# Patient Record
Sex: Male | Born: 1960 | Race: Black or African American | Hispanic: No | Marital: Single | Smoking: Current every day smoker
Health system: Southern US, Community
[De-identification: ages and names within clinical notes are randomized; demographics above are authoritative.]

## PROBLEM LIST (undated history)

## (undated) ENCOUNTER — Emergency Department (HOSPITAL_COMMUNITY): Payer: Medicare Other

## (undated) DIAGNOSIS — I10 Essential (primary) hypertension: Secondary | ICD-10-CM

## (undated) DIAGNOSIS — M722 Plantar fascial fibromatosis: Secondary | ICD-10-CM

## (undated) DIAGNOSIS — K259 Gastric ulcer, unspecified as acute or chronic, without hemorrhage or perforation: Secondary | ICD-10-CM

## (undated) DIAGNOSIS — G709 Myoneural disorder, unspecified: Secondary | ICD-10-CM

## (undated) DIAGNOSIS — N200 Calculus of kidney: Secondary | ICD-10-CM

## (undated) DIAGNOSIS — IMO0001 Reserved for inherently not codable concepts without codable children: Secondary | ICD-10-CM

## (undated) DIAGNOSIS — M48061 Spinal stenosis, lumbar region without neurogenic claudication: Secondary | ICD-10-CM

## (undated) HISTORY — DX: Essential (primary) hypertension: I10

## (undated) HISTORY — PX: MANDIBLE FRACTURE SURGERY: SHX706

## (undated) HISTORY — DX: Spinal stenosis, lumbar region without neurogenic claudication: M48.061

## (undated) HISTORY — PX: OTHER SURGICAL HISTORY: SHX169

## (undated) HISTORY — DX: Myoneural disorder, unspecified: G70.9

---

## 1998-09-21 ENCOUNTER — Encounter: Payer: Self-pay | Admitting: Emergency Medicine

## 1998-09-21 ENCOUNTER — Emergency Department (HOSPITAL_COMMUNITY): Admission: EM | Admit: 1998-09-21 | Discharge: 1998-09-21 | Payer: Self-pay | Admitting: Emergency Medicine

## 1998-09-22 ENCOUNTER — Observation Stay (HOSPITAL_COMMUNITY): Admission: RE | Admit: 1998-09-22 | Discharge: 1998-09-22 | Payer: Self-pay | Admitting: Oral and Maxillofacial Surgery

## 1998-09-22 ENCOUNTER — Encounter: Payer: Self-pay | Admitting: Oral and Maxillofacial Surgery

## 2002-01-11 ENCOUNTER — Emergency Department (HOSPITAL_COMMUNITY): Admission: EM | Admit: 2002-01-11 | Discharge: 2002-01-11 | Payer: Self-pay | Admitting: Emergency Medicine

## 2002-01-11 ENCOUNTER — Encounter: Payer: Self-pay | Admitting: Emergency Medicine

## 2003-09-09 ENCOUNTER — Emergency Department (HOSPITAL_COMMUNITY): Admission: EM | Admit: 2003-09-09 | Discharge: 2003-09-09 | Payer: Self-pay | Admitting: Emergency Medicine

## 2004-11-10 ENCOUNTER — Emergency Department (HOSPITAL_COMMUNITY): Admission: EM | Admit: 2004-11-10 | Discharge: 2004-11-10 | Payer: Self-pay | Admitting: Emergency Medicine

## 2005-08-20 ENCOUNTER — Encounter: Admission: RE | Admit: 2005-08-20 | Discharge: 2005-08-20 | Payer: Self-pay | Admitting: Internal Medicine

## 2006-12-08 ENCOUNTER — Emergency Department (HOSPITAL_COMMUNITY): Admission: EM | Admit: 2006-12-08 | Discharge: 2006-12-08 | Payer: Self-pay | Admitting: *Deleted

## 2007-12-16 ENCOUNTER — Emergency Department (HOSPITAL_COMMUNITY): Admission: EM | Admit: 2007-12-16 | Discharge: 2007-12-16 | Payer: Self-pay | Admitting: Emergency Medicine

## 2009-10-21 ENCOUNTER — Emergency Department (HOSPITAL_COMMUNITY): Admission: EM | Admit: 2009-10-21 | Discharge: 2009-10-21 | Payer: Self-pay | Admitting: Family Medicine

## 2009-10-23 ENCOUNTER — Emergency Department (HOSPITAL_COMMUNITY): Admission: EM | Admit: 2009-10-23 | Discharge: 2009-10-23 | Payer: Self-pay | Admitting: Emergency Medicine

## 2011-04-11 LAB — CBC
HCT: 35.5 — ABNORMAL LOW
Hemoglobin: 12.5 — ABNORMAL LOW
Platelets: 197
WBC: 4.8

## 2011-04-11 LAB — DIFFERENTIAL
Eosinophils Relative: 3
Lymphocytes Relative: 42
Lymphs Abs: 2
Monocytes Absolute: 0.5

## 2011-04-11 LAB — POCT I-STAT, CHEM 8
BUN: 17
Calcium, Ion: 1.19
Chloride: 106
Potassium: 4.6

## 2011-04-11 LAB — POCT CARDIAC MARKERS
CKMB, poc: <1 — ABNORMAL LOW
Operator id: 231701
Troponin i, poc: <0.05

## 2011-04-11 LAB — D-DIMER, QUANTITATIVE: D-Dimer, Quant: 0.54 — ABNORMAL HIGH

## 2012-05-22 ENCOUNTER — Emergency Department (HOSPITAL_COMMUNITY)
Admission: EM | Admit: 2012-05-22 | Discharge: 2012-05-22 | Disposition: A | Discharge status: Home / Self Care | Payer: Medicaid Other | Attending: Emergency Medicine | Admitting: Emergency Medicine

## 2012-05-22 ENCOUNTER — Emergency Department (HOSPITAL_COMMUNITY): Payer: Medicaid Other

## 2012-05-22 ENCOUNTER — Encounter (HOSPITAL_COMMUNITY): Payer: Self-pay | Admitting: Cardiology

## 2012-05-22 DIAGNOSIS — Y9389 Activity, other specified: Secondary | ICD-10-CM | POA: Insufficient documentation

## 2012-05-22 DIAGNOSIS — R0989 Other specified symptoms and signs involving the circulatory and respiratory systems: Secondary | ICD-10-CM | POA: Insufficient documentation

## 2012-05-22 DIAGNOSIS — W1809XA Striking against other object with subsequent fall, initial encounter: Secondary | ICD-10-CM | POA: Insufficient documentation

## 2012-05-22 DIAGNOSIS — R0609 Other forms of dyspnea: Secondary | ICD-10-CM | POA: Insufficient documentation

## 2012-05-22 DIAGNOSIS — S20219A Contusion of unspecified front wall of thorax, initial encounter: Secondary | ICD-10-CM

## 2012-05-22 DIAGNOSIS — Y9289 Other specified places as the place of occurrence of the external cause: Secondary | ICD-10-CM | POA: Insufficient documentation

## 2012-05-22 DIAGNOSIS — F17200 Nicotine dependence, unspecified, uncomplicated: Secondary | ICD-10-CM | POA: Insufficient documentation

## 2012-05-22 DIAGNOSIS — R059 Cough, unspecified: Secondary | ICD-10-CM | POA: Insufficient documentation

## 2012-05-22 DIAGNOSIS — R05 Cough: Secondary | ICD-10-CM | POA: Insufficient documentation

## 2012-05-22 MED ORDER — MELOXICAM 15 MG PO TABS: 15.0000 mg | ORAL_TABLET | Freq: Every day | ORAL | Status: DC

## 2012-05-22 MED ORDER — HYDROCODONE-ACETAMINOPHEN 5-325 MG PO TABS
1.0000 | ORAL_TABLET | ORAL | Status: DC | PRN
Start: 2012-05-22 — End: 2015-08-14

## 2012-05-22 NOTE — ED Notes (Addendum)
Pt reports he was playing with his daughter and left on his left side on Wednesday. Now complaining of rib pain and pain with inspiration.

## 2012-05-22 NOTE — ED Provider Notes (Signed)
Medical screening examination/treatment/procedure(s) were conducted as a shared visit with non-physician practitioner(s) and myself.  I personally evaluated the patient during the encounter  3 days of L lateral rib pain after wrestling with adult daughter. Worse with movement. TTP lateral ribs, no SOB.  Breath sounds equal.  EMERGENCY DEPARTMENT [place] FAST EXAM  INDICATIONS:Blunt trauma to the Thorax  PERFORMED BY: Myself  IMAGES ARCHIVED?: No  FINDINGS: All views negative  LIMITATIONS:    INTERPRETATION:  No abdominal free fluid and No pericardial effusion  COMMENT:        Wesley Octave, MD 05/22/12 1829

## 2012-05-22 NOTE — ED Provider Notes (Signed)
History     CSN: [id number]  Arrival date & time 05/22/12  8657   First MD Initiated Contact with Patient 05/22/12 0945      Chief Complaint  Patient presents with  . rib pain     (Consider location/radiation/quality/duration/timing/severity/associated sxs/prior treatment) HPI Comments: Wesley Prince 51 y.o. male   The chief complaint is: Patient presents with:   rib pain    Onset Wednesday wrestling. Slipped and fell side of sofa.  Immediated pain.  Pain has increased steadily since that time. Unsure if there is bruising or swelling. Pain with inspiration and movement of arm, cough, twisting. on the left rib cage.  Pain is constant. sitting still is less painful. Patient has not taken any medication to treat. Interfering with ADLs and ability to perform at work which involves heavy lifting.   + smoker- trying to quit.    2 brothers with CAD and stents. Denies  chest tightness or pressure, radiation to left arm, jaw or back, nausea, or diaphoresis.       The history is provided by the patient and medical records. No language interpreter was used.    History reviewed. No pertinent past medical history.  History reviewed. No pertinent past surgical history.  History reviewed. No pertinent family history.  History  Substance Use Topics  . Smoking status: Current Every Day Smoker  . Smokeless tobacco: Not on file  . Alcohol Use: Yes      Review of Systems  Review of Systems  Constitutional: Negative.  Negative for fever and chills.  HENT: Negative.   Eyes: Negative.   Respiratory: Positive for chest wall pain.  Negative wheezing, cough, congestion, shortness of breath. Cardiovascular: Negative.  Negative for chest pain and palpitations.  Gastrointestinal: Negative.  Negative for vomiting, abdominal pain, diarrhea and constipation.  Genitourinary: Negative.  Negative for dysuria, urgency and frequency.  Musculoskeletal: Negative.  Negative for myalgias and  arthralgias.  Skin: Negative for rash and wound.  Neurological: Negative.  Negative for headaches.  Psychiatric/Behavioral: Negative.   All other systems reviewed and are negative.    Allergies  Review of patient's allergies indicates no known allergies.  Home Medications  No current outpatient prescriptions on file.  BP 132/78  Pulse 82  Temp 98 F (36.7 C) (Oral)  Resp 18  SpO2 98%  Physical Exam Physical Exam  Nursing note and vitals reviewed. Constitutional: He appears well-developed and well-nourished. No distress.  HENT:  Head: Normocephalic and atraumatic.  Eyes: Conjunctivae normal are normal. No scleral icterus.  Neck: Normal range of motion. Neck supple.  Cardiovascular: Normal rate, regular rhythm and normal heart sounds.   Pulmonary/Chest:  No respiratory distress.  oxygen saturation 100% on monitor.  And room air.  Tender to palpation of left anterior chest wall at approximately ribs 7, 8, and 9.  No wheezing, rhonchi, or decreased breath sounds. Abdominal: Soft. There is no tenderness.  Musculoskeletal: He exhibits no edema.  Neurological: He is alert.  Skin: Skin is warm and dry. He is not diaphoretic.  Psychiatric: His behavior is normal.    ED Course  Procedures (including critical care time)  Labs Reviewed - No data to display Dg Ribs Bilateral  05/22/2012  *RADIOLOGY REPORT*  Clinical Data: Fall.  Left chest wall pain.  BILATERAL RIBS - 3+ VIEW  Comparison: Chest CT, 12/16/2007  Findings: No rib fracture or rib lesion.  The visualized lungs are clear.  No pneumothorax.  IMPRESSION: No rib fracture or rib lesion.  Original Report Authenticated By: Amie Portland, M.D.      No diagnosis found.    MDM  10:32 AM BP 132/78  Pulse 82  Temp 98 F (36.7 C) (Oral)  Resp 18  SpO2 98% Patient presents with chest wall pain.  History and physical exam indicate musculoskeletal origin versus ACS or pulmonary problem.  I do not suspect pulmonary embolus.   She does have significant family history of coronary artery disease and he is a smoker.  However symptoms are reproducible with palpation of the chest wall.  The began with a rib x-ray to rule out fracture.  If negative he may warrant further workup.     11:34 AM BP 132/78  Pulse 82  Temp 98 F (36.7 C) (Oral)  Resp 18  SpO2 98% Neg xray.  3 days since onset.  I saw patient in shared visit with Dr. Manus Gunning who did Bedside US.  No free fluid and organs are intact. Will treat patient for Rib contusion. Discussed reasons to seek immediate care. Patient expresses understanding and agrees with plan.    Arthor Captain, PA-C 05/22/12 1136

## 2015-02-07 ENCOUNTER — Encounter (HOSPITAL_COMMUNITY): Payer: Self-pay | Admitting: Emergency Medicine

## 2015-02-07 ENCOUNTER — Emergency Department (HOSPITAL_COMMUNITY)
Admission: EM | Admit: 2015-02-07 | Discharge: 2015-02-07 | Discharge status: Against Medical Advice | Payer: Medicaid Other | Attending: Emergency Medicine | Admitting: Emergency Medicine

## 2015-02-07 DIAGNOSIS — Z720 Tobacco use: Secondary | ICD-10-CM | POA: Diagnosis not present

## 2015-02-07 DIAGNOSIS — R319 Hematuria, unspecified: Secondary | ICD-10-CM | POA: Diagnosis present

## 2015-02-07 HISTORY — DX: Gastric ulcer, unspecified as acute or chronic, without hemorrhage or perforation: K25.9

## 2015-02-07 HISTORY — DX: Calculus of kidney: N20.0

## 2015-02-07 HISTORY — DX: Plantar fascial fibromatosis: M72.2

## 2015-02-07 LAB — URINALYSIS, ROUTINE W REFLEX MICROSCOPIC
BILIRUBIN URINE: NEGATIVE
GLUCOSE, UA: NEGATIVE mg/dL
KETONES UR: NEGATIVE mg/dL
Nitrite: POSITIVE — AB
Protein, ur: NEGATIVE mg/dL
SPECIFIC GRAVITY, URINE: 1.028 (ref 1.005–1.030)
UROBILINOGEN UA: 0.2 mg/dL (ref 0.0–1.0)
pH: 5.5 (ref 5.0–8.0)

## 2015-02-07 LAB — URINE MICROSCOPIC-ADD ON

## 2015-02-07 NOTE — ED Notes (Signed)
Pt states he is "pissing blood"  Pt states it started yesterday  Pt states he has pain in his bilateral flank area, lower abdomen, and states at the end of his stream when he urinates he has pain  Pt states it started with flecks of blood but now at the end of his stream he has drops of blood that comes out of his penis  Pt states today he also coughed up some blood

## 2015-02-08 ENCOUNTER — Encounter (HOSPITAL_COMMUNITY): Payer: Self-pay

## 2015-02-08 ENCOUNTER — Emergency Department (HOSPITAL_COMMUNITY): Payer: Medicaid Other

## 2015-02-08 ENCOUNTER — Emergency Department (HOSPITAL_COMMUNITY)
Admission: EM | Admit: 2015-02-08 | Discharge: 2015-02-08 | Disposition: A | Discharge status: Home / Self Care | Payer: Medicaid Other | Attending: Emergency Medicine | Admitting: Emergency Medicine

## 2015-02-08 DIAGNOSIS — Z8739 Personal history of other diseases of the musculoskeletal system and connective tissue: Secondary | ICD-10-CM | POA: Insufficient documentation

## 2015-02-08 DIAGNOSIS — Z87442 Personal history of urinary calculi: Secondary | ICD-10-CM | POA: Insufficient documentation

## 2015-02-08 DIAGNOSIS — Z720 Tobacco use: Secondary | ICD-10-CM | POA: Insufficient documentation

## 2015-02-08 DIAGNOSIS — N390 Urinary tract infection, site not specified: Secondary | ICD-10-CM | POA: Insufficient documentation

## 2015-02-08 DIAGNOSIS — Z8719 Personal history of other diseases of the digestive system: Secondary | ICD-10-CM | POA: Insufficient documentation

## 2015-02-08 LAB — CBC WITH DIFFERENTIAL/PLATELET
BASOS ABS: 0 10*3/uL (ref 0.0–0.1)
BASOS PCT: 0 % (ref 0–1)
EOS PCT: 3 % (ref 0–5)
Eosinophils Absolute: 0.2 10*3/uL (ref 0.0–0.7)
HCT: 41.6 % (ref 39.0–52.0)
Hemoglobin: 13.9 g/dL (ref 13.0–17.0)
LYMPHS PCT: 41 % (ref 12–46)
Lymphs Abs: 3.2 10*3/uL (ref 0.7–4.0)
MCH: 30.2 pg (ref 26.0–34.0)
MCHC: 33.4 g/dL (ref 30.0–36.0)
MCV: 90.2 fL (ref 78.0–100.0)
MONOS PCT: 7 % (ref 3–12)
Monocytes Absolute: 0.5 10*3/uL (ref 0.1–1.0)
NEUTROS ABS: 3.8 10*3/uL (ref 1.7–7.7)
NEUTROS PCT: 49 % (ref 43–77)
PLATELETS: 189 10*3/uL (ref 150–400)
RBC: 4.61 MIL/uL (ref 4.22–5.81)
RDW: 12.8 % (ref 11.5–15.5)
WBC: 7.8 10*3/uL (ref 4.0–10.5)

## 2015-02-08 LAB — I-STAT CHEM 8, ED
BUN: 22 mg/dL — ABNORMAL HIGH (ref 6–20)
CREATININE: 1.2 mg/dL (ref 0.61–1.24)
Calcium, Ion: 1.2 mmol/L (ref 1.12–1.23)
Chloride: 104 mmol/L (ref 101–111)
Glucose, Bld: 98 mg/dL (ref 65–99)
HEMATOCRIT: 45 % (ref 39.0–52.0)
Hemoglobin: 15.3 g/dL (ref 13.0–17.0)
POTASSIUM: 4 mmol/L (ref 3.5–5.1)
SODIUM: 139 mmol/L (ref 135–145)
TCO2: 23 mmol/L (ref 0–100)

## 2015-02-08 LAB — URINE MICROSCOPIC-ADD ON

## 2015-02-08 LAB — URINALYSIS, ROUTINE W REFLEX MICROSCOPIC
BILIRUBIN URINE: NEGATIVE
Glucose, UA: NEGATIVE mg/dL
KETONES UR: NEGATIVE mg/dL
Nitrite: POSITIVE — AB
Protein, ur: NEGATIVE mg/dL
Specific Gravity, Urine: 1.023 (ref 1.005–1.030)
Urobilinogen, UA: 0.2 mg/dL (ref 0.0–1.0)
pH: 5.5 (ref 5.0–8.0)

## 2015-02-08 MED ORDER — KETOROLAC TROMETHAMINE 60 MG/2ML IM SOLN
60.0000 mg | Freq: Once | INTRAMUSCULAR | Status: AC
  Administered 2015-02-08: 60 mg via INTRAMUSCULAR
  Filled 2015-02-08: qty 2

## 2015-02-08 MED ORDER — CEPHALEXIN 500 MG PO CAPS: 500.0000 mg | ORAL_CAPSULE | Freq: Four times a day (QID) | ORAL | Status: DC

## 2015-02-08 MED ORDER — CEPHALEXIN 500 MG PO CAPS
500.0000 mg | ORAL_CAPSULE | Freq: Once | ORAL | Status: AC
  Administered 2015-02-08: 500 mg via ORAL
  Filled 2015-02-08: qty 1

## 2015-02-08 MED ORDER — TRAMADOL HCL 50 MG PO TABS: 50.0000 mg | ORAL_TABLET | Freq: Four times a day (QID) | ORAL | Status: DC | PRN

## 2015-02-08 NOTE — ED Notes (Signed)
PA at bedside.

## 2015-02-08 NOTE — ED Provider Notes (Signed)
CSN: [id number]     Arrival date & time 02/08/15  1615 History   First MD Initiated Contact with Patient 02/08/15 1913     Chief Complaint  Patient presents with  . Hematuria  . Flank Pain     (Consider location/radiation/quality/duration/timing/severity/associated sxs/prior Treatment) HPI Wesley Prince is a 54 y.o. male with history of kidney stones, gastric ulcers, presents to emergency department complaining of left flank pain. Patient states his pain started 3 days ago. He reports hematuria, dysuria, urinary frequency and urgency. He states today his symptoms seemed to improve, however he decided to come in and get evaluated. Patient actually came to emergency department yesterday but left because of long wait. She denies any fever. There is no nausea or vomiting. Denies any constipation diarrhea. Denies any blood in his stool or emesis. He states he has had kidney stones in the past and this feels slightly different.  Past Medical History  Diagnosis Date  . Kidney stones   . Gastric ulcer   . Plantar fasciitis    Past Surgical History  Procedure Laterality Date  . Mandible fracture surgery    . Cyst removed from coccyx     Family History  Problem Relation Age of Onset  . CAD Mother   . Hypertension Mother    History  Substance Use Topics  . Smoking status: Current Every Day Smoker  . Smokeless tobacco: Not on file  . Alcohol Use: Yes    Review of Systems  Constitutional: Negative for fever and chills.  Respiratory: Negative for cough, chest tightness and shortness of breath.   Cardiovascular: Negative for chest pain, palpitations and leg swelling.  Gastrointestinal: Negative for nausea, vomiting, abdominal pain, diarrhea and abdominal distention.  Genitourinary: Positive for dysuria, urgency, frequency, hematuria and flank pain.  Musculoskeletal: Negative for myalgias, arthralgias, neck pain and neck stiffness.  Skin: Negative for rash.  Allergic/Immunologic:  Negative for immunocompromised state.  Neurological: Negative for dizziness, weakness, light-headedness, numbness and headaches.  All other systems reviewed and are negative.     Allergies  Review of patient's allergies indicates no known allergies.  Home Medications   Prior to Admission medications   Medication Sig Start Date End Date Taking? Authorizing Provider  Sildenafil Citrate (VIAGRA PO) Take by mouth once.   Yes Historical Provider, MD  cephALEXin (KEFLEX) 500 MG capsule Take 1 capsule (500 mg total) by mouth 4 (four) times daily. 02/08/15   Constancia Geeting, PA-C  HYDROcodone-acetaminophen (NORCO) 5-325 MG per tablet Take 1-2 tablets by mouth every 4 (four) hours as needed for pain. Patient not taking: Reported on 02/08/2015 05/22/12   Margarita Mail, PA-C  meloxicam (MOBIC) 15 MG tablet Take 1 tablet (15 mg total) by mouth daily. With food. Patient not taking: Reported on 02/08/2015 05/22/12   Margarita Mail, PA-C  traMADol (ULTRAM) 50 MG tablet Take 1 tablet (50 mg total) by mouth every 6 (six) hours as needed. 02/08/15   Husam Hohn, PA-C   BP 141/98 mmHg  Pulse 64  Temp(Src) 98 F (36.7 C) (Oral)  Resp 18  SpO2 100% Physical Exam  Constitutional: He appears well-developed and well-nourished. No distress.  HENT:  Head: Normocephalic and atraumatic.  Eyes: Conjunctivae are normal.  Neck: Neck supple.  Cardiovascular: Normal rate, regular rhythm and normal heart sounds.   Pulmonary/Chest: Effort normal. No respiratory distress. He has no wheezes. He has no rales.  Abdominal: Soft. Bowel sounds are normal. He exhibits no distension. There is tenderness. There is no  rebound.  Suprapubic tenderness. No CVA tenderness bilaterally  Musculoskeletal: He exhibits no edema.  Neurological: He is alert.  Skin: Skin is warm and dry.  Nursing note and vitals reviewed.   ED Course  Procedures (including critical care time) Labs Review Labs Reviewed  URINALYSIS, ROUTINE  W REFLEX MICROSCOPIC (NOT AT [facility]) - Abnormal; Notable for the following:    APPearance CLOUDY (*)    Hgb urine dipstick MODERATE (*)    Nitrite POSITIVE (*)    Leukocytes, UA MODERATE (*)    All other components within normal limits  URINE MICROSCOPIC-ADD ON - Abnormal; Notable for the following:    Bacteria, UA MANY (*)    All other components within normal limits  I-STAT CHEM 8, ED - Abnormal; Notable for the following:    BUN 22 (*)    All other components within normal limits  URINE CULTURE  CBC WITH DIFFERENTIAL/PLATELET    Imaging Review Ct Abdomen Pelvis Wo Contrast  02/08/2015   CLINICAL DATA:  Left flank pain, dysuria, and gross hematuria since yesterday. Nephrolithiasis.  EXAM: CT ABDOMEN AND PELVIS WITHOUT CONTRAST  TECHNIQUE: Multidetector CT imaging of the abdomen and pelvis was performed following the standard protocol without IV contrast.  COMPARISON:  12/08/2006  FINDINGS: Lower chest: No acute findings.  Hepatobiliary:  No mass visualized on this unenhanced exam.  Pancreas: No mass or inflammatory process visualized on this unenhanced exam.  Spleen:  Within normal limits in size.  Adrenal Glands:  No masses identified.  Kidneys/Urinary tract: No evidence of urolithiasis or hydronephrosis.  Stomach/Bowel/Peritoneum: Unremarkable. Normal appendix visualized.  Vascular/Lymphatic: No pathologically enlarged lymph nodes identified. No abdominal aortic aneurysm or other significant retroperitoneal abnormality demonstrated.  Reproductive:  No mass or other significant abnormality noted.  Other:  None.  Musculoskeletal:  No suspicious bone lesions identified.  IMPRESSION: No evidence of urolithiasis, hydronephrosis, or other acute findings.   Electronically Signed   By: Earle Gell M.D.   On: 02/08/2015 21:38     EKG Interpretation None      MDM   Final diagnoses:  UTI (lower urinary tract infection)    Patient is here with urinary symptoms, hematuria, flank pain. He  actually states that today his symptoms are better. History of kidney stones. He does not seem to be in a lot of pain at this time. We'll check urine, CT abdomen and pelvis to rule out a kidney stone, labs. Patient is afebrile, normal vital signs at this time.   Patient's labs unremarkable. His urine is nitrite positive with moderate leukocyte, 21-50 white blood cells and many bacteria. A CT scan is negative. This is consistent with UTI. Had this time he denies any flank pain. He states he is having urinary frequency, urgency, dysuria. Will start on Keflex. Follow-up with primary care doctor. Return precautions discussed.  Jeannett Senior, PA-C 02/09/15 0004  Sherwood Gambler, MD 02/13/15 [phone]

## 2015-02-08 NOTE — ED Notes (Signed)
Patient transported to CT 

## 2015-02-08 NOTE — ED Notes (Signed)
Pt reporting worsening L flank pain. States he drove himself here, but could get a ride home. Pt also asking for food. PA aware.

## 2015-02-08 NOTE — Discharge Instructions (Signed)
Ibuprofen for pain. Tramadol for severe pain. Keflex as prescribed for an infection, take until all gone. Please follow-up with your doctor for recheck. Please return if her symptoms are worsening.  Urinary Tract Infection Urinary tract infections (UTIs) can develop anywhere along your urinary tract. Your urinary tract is your body's drainage system for removing wastes and extra water. Your urinary tract includes two kidneys, two ureters, a bladder, and a urethra. Your kidneys are a pair of bean-shaped organs. Each kidney is about the size of your fist. They are located below your ribs, one on each side of your spine. CAUSES Infections are caused by microbes, which are microscopic organisms, including fungi, viruses, and bacteria. These organisms are so small that they can only be seen through a microscope. Bacteria are the microbes that most commonly cause UTIs. SYMPTOMS  Symptoms of UTIs may vary by age and gender of the patient and by the location of the infection. Symptoms in young women typically include a frequent and intense urge to urinate and a painful, burning feeling in the bladder or urethra during urination. Older women and men are more likely to be tired, shaky, and weak and have muscle aches and abdominal pain. A fever may mean the infection is in your kidneys. Other symptoms of a kidney infection include pain in your back or sides below the ribs, nausea, and vomiting. DIAGNOSIS To diagnose a UTI, your caregiver will ask you about your symptoms. Your caregiver also will ask to provide a urine sample. The urine sample will be tested for bacteria and white blood cells. White blood cells are made by your body to help fight infection. TREATMENT  Typically, UTIs can be treated with medication. Because most UTIs are caused by a bacterial infection, they usually can be treated with the use of antibiotics. The choice of antibiotic and length of treatment depend on your symptoms and the type of  bacteria causing your infection. HOME CARE INSTRUCTIONS  If you were prescribed antibiotics, take them exactly as your caregiver instructs you. Finish the medication even if you feel better after you have only taken some of the medication.  Drink enough water and fluids to keep your urine clear or pale yellow.  Avoid caffeine, tea, and carbonated beverages. They tend to irritate your bladder.  Empty your bladder often. Avoid holding urine for long periods of time.  Empty your bladder before and after sexual intercourse.  After a bowel movement, women should cleanse from front to back. Use each tissue only once. SEEK MEDICAL CARE IF:   You have back pain.  You develop a fever.  Your symptoms do not begin to resolve within 3 days. SEEK IMMEDIATE MEDICAL CARE IF:   You have severe back pain or lower abdominal pain.  You develop chills.  You have nausea or vomiting.  You have continued burning or discomfort with urination. MAKE SURE YOU:   Understand these instructions.  Will watch your condition.  Will get help right away if you are not doing well or get worse. Document Released: 04/10/2005 Document Revised: 12/31/2011 Document Reviewed: 08/09/2011 [facility] Patient Information 2015 [place], [place]. This information is not intended to replace advice given to you by your health care provider. Make sure you discuss any questions you have with your health care provider.

## 2015-02-08 NOTE — ED Notes (Signed)
Patient states he was in the ED yesterday, but left before being seen because the wait was too long. Patient states he had bright red blood in his urine yesterday along with dysuria and bilateral flank pain. Today, he has not seen any blood and still has left flank pain and dysuria.

## 2015-02-11 LAB — URINE CULTURE

## 2015-02-13 ENCOUNTER — Telehealth (HOSPITAL_COMMUNITY): Payer: Self-pay

## 2015-02-13 NOTE — Telephone Encounter (Signed)
Post ED Visit - Positive Culture Follow-up  Culture report reviewed by antimicrobial stewardship pharmacist: []  [place], Pharm.D., BCPS [x]  Heide Guile, Pharm.D., BCPS []  Alycia Rossetti, Pharm.D., BCPS []  [place], Pharm.D., BCPS, AAHIVP []  Legrand [place]o, Pharm.D., BCPS, AAHIVP []  Isac Sarna, Pharm.D., BCPS  Positive Urine culture, >/= 100,000 colonies -> E Coli Treated with Cephalexin, organism sensitive to the same and no further patient follow-up is required at this time.  Dortha Kern 02/13/2015, 5:45 AM

## 2015-07-27 ENCOUNTER — Inpatient Hospital Stay (HOSPITAL_COMMUNITY): Payer: Medicaid Other

## 2015-07-27 ENCOUNTER — Emergency Department (HOSPITAL_COMMUNITY): Payer: Medicaid Other

## 2015-07-27 ENCOUNTER — Other Ambulatory Visit: Payer: Self-pay

## 2015-07-27 ENCOUNTER — Encounter (HOSPITAL_COMMUNITY): Payer: Self-pay | Admitting: *Deleted

## 2015-07-27 ENCOUNTER — Inpatient Hospital Stay (HOSPITAL_COMMUNITY)
Admission: EM | Admit: 2015-07-27 | Discharge: 2015-08-01 | DRG: 184 | Disposition: A | Discharge status: Home Health | Payer: Medicaid Other | Attending: General Surgery | Admitting: General Surgery

## 2015-07-27 ENCOUNTER — Observation Stay (HOSPITAL_COMMUNITY): Payer: Medicaid Other

## 2015-07-27 DIAGNOSIS — S62354A Nondisplaced fracture of shaft of fourth metacarpal bone, right hand, initial encounter for closed fracture: Secondary | ICD-10-CM | POA: Diagnosis present

## 2015-07-27 DIAGNOSIS — S2241XA Multiple fractures of ribs, right side, initial encounter for closed fracture: Secondary | ICD-10-CM | POA: Diagnosis present

## 2015-07-27 DIAGNOSIS — S83521A Sprain of posterior cruciate ligament of right knee, initial encounter: Secondary | ICD-10-CM | POA: Diagnosis present

## 2015-07-27 DIAGNOSIS — S060X9A Concussion with loss of consciousness of unspecified duration, initial encounter: Secondary | ICD-10-CM | POA: Diagnosis present

## 2015-07-27 DIAGNOSIS — S2231XA Fracture of one rib, right side, initial encounter for closed fracture: Secondary | ICD-10-CM

## 2015-07-27 DIAGNOSIS — S83104A Unspecified dislocation of right knee, initial encounter: Secondary | ICD-10-CM | POA: Diagnosis present

## 2015-07-27 DIAGNOSIS — I959 Hypotension, unspecified: Secondary | ICD-10-CM

## 2015-07-27 DIAGNOSIS — S62309A Unspecified fracture of unspecified metacarpal bone, initial encounter for closed fracture: Secondary | ICD-10-CM | POA: Diagnosis present

## 2015-07-27 DIAGNOSIS — S82141A Displaced bicondylar fracture of right tibia, initial encounter for closed fracture: Secondary | ICD-10-CM | POA: Diagnosis present

## 2015-07-27 DIAGNOSIS — S060XAA Concussion with loss of consciousness status unknown, initial encounter: Secondary | ICD-10-CM | POA: Diagnosis present

## 2015-07-27 DIAGNOSIS — Z23 Encounter for immunization: Secondary | ICD-10-CM

## 2015-07-27 DIAGNOSIS — S12500A Unspecified displaced fracture of sixth cervical vertebra, initial encounter for closed fracture: Secondary | ICD-10-CM | POA: Diagnosis present

## 2015-07-27 DIAGNOSIS — S0181XA Laceration without foreign body of other part of head, initial encounter: Secondary | ICD-10-CM | POA: Diagnosis present

## 2015-07-27 DIAGNOSIS — R109 Unspecified abdominal pain: Secondary | ICD-10-CM | POA: Diagnosis present

## 2015-07-27 DIAGNOSIS — S129XXA Fracture of neck, unspecified, initial encounter: Secondary | ICD-10-CM

## 2015-07-27 DIAGNOSIS — S83106A Unspecified dislocation of unspecified knee, initial encounter: Secondary | ICD-10-CM | POA: Diagnosis present

## 2015-07-27 DIAGNOSIS — S76111A Strain of right quadriceps muscle, fascia and tendon, initial encounter: Secondary | ICD-10-CM | POA: Diagnosis present

## 2015-07-27 DIAGNOSIS — S83194A Other dislocation of right knee, initial encounter: Secondary | ICD-10-CM | POA: Diagnosis present

## 2015-07-27 DIAGNOSIS — M25569 Pain in unspecified knee: Secondary | ICD-10-CM

## 2015-07-27 DIAGNOSIS — S01112A Laceration without foreign body of left eyelid and periocular area, initial encounter: Secondary | ICD-10-CM | POA: Diagnosis present

## 2015-07-27 DIAGNOSIS — R079 Chest pain, unspecified: Secondary | ICD-10-CM

## 2015-07-27 LAB — TYPE AND SCREEN
ABO/RH(D): A POS
Antibody Screen: NEGATIVE
UNIT DIVISION: 0
Unit division: 0

## 2015-07-27 LAB — CBC
HEMATOCRIT: 37.8 % — AB (ref 39.0–52.0)
HEMOGLOBIN: 12.8 g/dL — AB (ref 13.0–17.0)
MCH: 30.8 pg (ref 26.0–34.0)
MCHC: 33.9 g/dL (ref 30.0–36.0)
MCV: 90.9 fL (ref 78.0–100.0)
Platelets: 169 10*3/uL (ref 150–400)
RBC: 4.16 MIL/uL — AB (ref 4.22–5.81)
RDW: 13.1 % (ref 11.5–15.5)
WBC: 8.2 10*3/uL (ref 4.0–10.5)

## 2015-07-27 LAB — COMPREHENSIVE METABOLIC PANEL
ALT: 36 U/L (ref 17–63)
ANION GAP: 14 (ref 5–15)
AST: 43 U/L — AB (ref 15–41)
Albumin: 3.4 g/dL — ABNORMAL LOW (ref 3.5–5.0)
Alkaline Phosphatase: 35 U/L — ABNORMAL LOW (ref 38–126)
BILIRUBIN TOTAL: 0.7 mg/dL (ref 0.3–1.2)
BUN: 8 mg/dL (ref 6–20)
CHLORIDE: 105 mmol/L (ref 101–111)
CO2: 21 mmol/L — ABNORMAL LOW (ref 22–32)
Calcium: 8.9 mg/dL (ref 8.9–10.3)
Creatinine, Ser: 1.06 mg/dL (ref 0.61–1.24)
Glucose, Bld: 107 mg/dL — ABNORMAL HIGH (ref 65–99)
POTASSIUM: 3.7 mmol/L (ref 3.5–5.1)
Sodium: 140 mmol/L (ref 135–145)
TOTAL PROTEIN: 6.3 g/dL — AB (ref 6.5–8.1)

## 2015-07-27 LAB — URINALYSIS, ROUTINE W REFLEX MICROSCOPIC
BILIRUBIN URINE: NEGATIVE
Glucose, UA: NEGATIVE mg/dL
HGB URINE DIPSTICK: NEGATIVE
Ketones, ur: NEGATIVE mg/dL
Leukocytes, UA: NEGATIVE
NITRITE: NEGATIVE
PROTEIN: NEGATIVE mg/dL
Specific Gravity, Urine: 1.036 — ABNORMAL HIGH (ref 1.005–1.030)
pH: 6 (ref 5.0–8.0)

## 2015-07-27 LAB — PREPARE FRESH FROZEN PLASMA
UNIT DIVISION: 0
Unit division: 0

## 2015-07-27 LAB — PROTIME-INR
INR: 1.03 (ref 0.00–1.49)
PROTHROMBIN TIME: 13.7 s (ref 11.6–15.2)

## 2015-07-27 LAB — I-STAT CHEM 8, ED
BUN: 8 mg/dL (ref 6–20)
Calcium, Ion: 1.04 mmol/L — ABNORMAL LOW (ref 1.12–1.23)
Chloride: 108 mmol/L (ref 101–111)
Creatinine, Ser: 0.9 mg/dL (ref 0.61–1.24)
GLUCOSE: 96 mg/dL (ref 65–99)
HCT: 34 % — ABNORMAL LOW (ref 39.0–52.0)
HEMOGLOBIN: 11.6 g/dL — AB (ref 13.0–17.0)
POTASSIUM: 3.5 mmol/L (ref 3.5–5.1)
SODIUM: 142 mmol/L (ref 135–145)
TCO2: 19 mmol/L (ref 0–100)

## 2015-07-27 LAB — ABO/RH: ABO/RH(D): A POS

## 2015-07-27 LAB — ETHANOL: Alcohol, Ethyl (B): 12 mg/dL — ABNORMAL HIGH (ref <5)

## 2015-07-27 LAB — BLOOD PRODUCT ORDER (VERBAL) VERIFICATION

## 2015-07-27 LAB — CDS SEROLOGY

## 2015-07-27 LAB — MRSA PCR SCREENING: MRSA by PCR: NEGATIVE

## 2015-07-27 MED ORDER — FENTANYL CITRATE (PF) 100 MCG/2ML IJ SOLN
100.0000 ug | INTRAMUSCULAR | Status: DC | PRN
  Administered 2015-07-27 – 2015-07-28 (×6): 100 ug via INTRAVENOUS
  Filled 2015-07-27 (×4): qty 2

## 2015-07-27 MED ORDER — ONDANSETRON HCL 4 MG/2ML IJ SOLN: 4.0000 mg | Freq: Four times a day (QID) | INTRAMUSCULAR | Status: DC | PRN

## 2015-07-27 MED ORDER — BISACODYL 10 MG RE SUPP: 10.0000 mg | Freq: Every day | RECTAL | Status: DC | PRN

## 2015-07-27 MED ORDER — PROPOFOL 10 MG/ML IV BOLUS
INTRAVENOUS | Status: AC | PRN
  Administered 2015-07-27: 86 mg via INTRAVENOUS

## 2015-07-27 MED ORDER — FENTANYL CITRATE (PF) 100 MCG/2ML IJ SOLN
100.0000 ug | Freq: Once | INTRAMUSCULAR | Status: AC
  Administered 2015-07-27: 100 ug via INTRAVENOUS
  Filled 2015-07-27: qty 2

## 2015-07-27 MED ORDER — SODIUM CHLORIDE 0.9 % IV SOLN
INTRAVENOUS | Status: DC
  Administered 2015-07-27: 08:00:00 via INTRAVENOUS

## 2015-07-27 MED ORDER — BACITRACIN ZINC 500 UNIT/GM EX OINT
TOPICAL_OINTMENT | Freq: Once | CUTANEOUS | Status: AC
  Administered 2015-07-27: 1 via TOPICAL
  Filled 2015-07-27: qty 0.9

## 2015-07-27 MED ORDER — ONDANSETRON HCL 4 MG PO TABS: 4.0000 mg | ORAL_TABLET | Freq: Four times a day (QID) | ORAL | Status: DC | PRN

## 2015-07-27 MED ORDER — TETANUS-DIPHTH-ACELL PERTUSSIS 5-2.5-18.5 LF-MCG/0.5 IM SUSP
0.5000 mL | Freq: Once | INTRAMUSCULAR | Status: AC
  Administered 2015-07-27: 0.5 mL via INTRAMUSCULAR
  Filled 2015-07-27: qty 0.5

## 2015-07-27 MED ORDER — POTASSIUM CHLORIDE IN NACL 20-0.9 MEQ/L-% IV SOLN
INTRAVENOUS | Status: DC
  Administered 2015-07-27 – 2015-07-28 (×3): via INTRAVENOUS
  Filled 2015-07-27 (×4): qty 1000

## 2015-07-27 MED ORDER — FENTANYL CITRATE (PF) 100 MCG/2ML IJ SOLN
INTRAMUSCULAR | Status: AC
  Administered 2015-07-27: 100 ug via INTRAVENOUS
  Filled 2015-07-27: qty 2

## 2015-07-27 MED ORDER — HYDROMORPHONE HCL 1 MG/ML IJ SOLN
0.5000 mg | INTRAMUSCULAR | Status: DC | PRN
  Administered 2015-07-27 – 2015-07-31 (×19): 1 mg via INTRAVENOUS
  Filled 2015-07-27 (×22): qty 1

## 2015-07-27 MED ORDER — IOHEXOL 300 MG/ML  SOLN
100.0000 mL | Freq: Once | INTRAMUSCULAR | Status: AC | PRN
  Administered 2015-07-27: 100 mL via INTRAVENOUS

## 2015-07-27 MED ORDER — DOCUSATE SODIUM 100 MG PO CAPS
100.0000 mg | ORAL_CAPSULE | Freq: Two times a day (BID) | ORAL | Status: DC
Start: 2015-07-27 — End: 2015-07-31
  Administered 2015-07-27 – 2015-07-30 (×8): 100 mg via ORAL
  Filled 2015-07-27 (×8): qty 1

## 2015-07-27 MED ORDER — OXYCODONE HCL 5 MG PO TABS
5.0000 mg | ORAL_TABLET | ORAL | Status: DC | PRN
  Administered 2015-07-27 – 2015-07-28 (×3): 10 mg via ORAL
  Filled 2015-07-27 (×3): qty 2

## 2015-07-27 MED ORDER — SODIUM CHLORIDE 0.9 % IV BOLUS (SEPSIS)
1000.0000 mL | Freq: Once | INTRAVENOUS | Status: AC
Start: 2015-07-27 — End: 2015-07-27
  Administered 2015-07-27: 1000 mL via INTRAVENOUS

## 2015-07-27 MED ORDER — PROPOFOL 10 MG/ML IV BOLUS
INTRAVENOUS | Status: AC
  Filled 2015-07-27: qty 20

## 2015-07-27 MED ORDER — LIDOCAINE-EPINEPHRINE 2 %-1:200000 IJ SOLN
20.0000 mL | Freq: Once | INTRAMUSCULAR | Status: AC
Start: 2015-07-27 — End: 2015-07-27
  Administered 2015-07-27: 20 mL
  Filled 2015-07-27: qty 20

## 2015-07-27 MED ORDER — PROPOFOL 10 MG/ML IV BOLUS
1.0000 mg/kg | Freq: Once | INTRAVENOUS | Status: AC
  Administered 2015-07-27: 86.2 mg via INTRAVENOUS
  Filled 2015-07-27: qty 20
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
## 2015-07-27 NOTE — ED Notes (Signed)
Fentanyl 141mg iv
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
## 2015-07-27 NOTE — ED Notes (Signed)
PA at bedside with suture cart

## 2015-07-27 NOTE — ED Notes (Signed)
Re-paged Dr. Christella Noa x2 to [phone]

## 2015-07-27 NOTE — Consult Note (Signed)
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 well as its right-sided articulating facet and lamina. Fracture extends into the C5-6 and C6-7 facet joints. The fracture within the vertebral body is situated in the sagittal plane. There is mild loss of vertebral body height anteriorly without flexion teardrop component or facet subluxation. No other acute cervical spine fractures are demonstrated. The alignment is near anatomic. There is posttraumatic deformity of the head of the right clavicle consistent with an old healed fracture. Mild cervical spondylosis is present. There are scattered air bubbles within the soft tissues of the neck, mostly intravascular and likely related to venous catheters. No acute soft tissue findings are identified within the neck. IMPRESSION: 1. Acute fractures of the right C6 vertebral body and posterior elements with involvement of the right facet joint and mild  loss vertebral body height. This is a potentially unstable injury. 2. No acute intracranial or maxillofacial findings. 3. Critical Value/emergent results were called by telephone at the time of interpretation on 07/27/2015 at 8:02 am to Dr. Pryor Curia , who verbally acknowledged these results. Electronically Signed   By: Richardean Sale M.D.   On:  07/27/2015 08:07    Review of Systems  Constitutional: Negative.   HENT: Negative.   Eyes: Negative.   Respiratory: Negative.   Cardiovascular: Negative.   Gastrointestinal: Negative.   Genitourinary: Negative.   Musculoskeletal: Positive for joint pain.  Skin: Negative.   Neurological: Negative.   Endo/Heme/Allergies: Negative.   Psychiatric/Behavioral: Negative.    Blood pressure 133/83, pulse 76, temperature 98.7 F (37.1 C), temperature source Oral, resp. rate 20, height '5\' 10"'  (1.778 m), weight 86.183 kg (190 lb), SpO2 98 %. Physical Exam  Constitutional: He is oriented to person, place, and time. He appears well-developed and well-nourished. He appears distressed.  HENT:  Head: Normocephalic.  Mouth/Throat: Oropharynx is clear and moist.  Bandage left forehead  Eyes: Conjunctivae and EOM are normal. Pupils are equal, round, and reactive to light.  Neck:  In cervical collar.   Musculoskeletal:  Right forearm in a splint Right knee in an immobilizer  Neurological: He is alert and oriented to person, place, and time. He has normal reflexes. No cranial nerve deficit. He exhibits normal muscle tone. Coordination normal.  Skin: Skin is warm and dry.  Psychiatric: He has a normal mood and affect. His behavior is normal. Judgment and thought content normal.    Assessment/Plan: Cervical collar at this time will be sufficient. He does not have a canal injury, or impingement of the neural elements. The collar needs to be worn at all times.  Will follow.   Siana Panameno L 07/27/2015, 7:52 PM
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
## 2015-07-27 NOTE — ED Notes (Signed)
3rd liter nss added to iv  [employer]
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
## 2015-07-27 NOTE — ED Notes (Signed)
To c-t.Marland Kitchen [place] chest and pelvis already done
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
## 2015-07-27 NOTE — Progress Notes (Signed)
Orthopedic Tech Progress Note Patient Details:  Wesley Prince September 03, 1960 [id number]  Ortho Devices Type of Ortho Device: Ace wrap, Ulna gutter splint Ortho Device/Splint Location: rue Ortho Device/Splint Interventions: Application Nursing Staff provided knee immobilizer  Perlita Forbush 07/27/2015, 10:11 AM

## 2015-07-27 NOTE — Progress Notes (Signed)
VASCULAR LAB PRELIMINARY  ARTERIAL  ABI completed:WNL    RIGHT    LEFT    PRESSURE WAVEFORM  PRESSURE WAVEFORM  BRACHIAL 120 T BRACHIAL 125 T  DP 154 T DP 163 T  AT   AT    PT 149 T PT 150 T  PER   PER    GREAT TOE  NA GREAT TOE  NA    RIGHT LEFT  ABI >1 >1     Tola Meas, RVT 07/27/2015, 3:02 PM

## 2015-07-27 NOTE — ED Provider Notes (Signed)
Patient was initially evaluated by Dr. Leonides Schanz. He has multiple fractures and injuries which were initially evaluated by her. I was asked to suture the patient's wounds.  Physical exam: Skin: 1.5 cm laceration along the right forehead with minimal active bleeding. I was able to see to the bottom of the wound. No bone was visualized. 1 cm laceration along the left upper eyelid that is not through and through. No active bleeding. No foreign bodies.  LACERATION REPAIR Performed by: Ottie Glazier Authorized by: Ottie Glazier Consent: Verbal consent obtained. Risks and benefits: risks, benefits and alternatives were discussed Consent given by: patient Patient identity confirmed: provided demographic data Prepped and Draped in normal sterile fashion Wound explored Laceration Location: right forehead Laceration Length: 1.5 cm No Foreign Bodies seen or palpated Anesthesia: local infiltration Local anesthetic: lidocaine 1% with epinephrine Anesthetic total: 1 ml Irrigation method: syringe Amount of cleaning: standard Skin closure: 5-0 Prolene Number of sutures: 3 Technique: Simple interrupted Patient tolerance: Patient tolerated the procedure well with no immediate complications.  LACERATION REPAIR Performed by: Ottie Glazier Authorized by: Ottie Glazier Consent: Verbal consent obtained. Risks and benefits: risks, benefits and alternatives were discussed Consent given by: patient Patient identity confirmed: provided demographic data Prepped and Draped in normal sterile fashion Wound explored Laceration Location: Left upper eyelid Laceration Length: 1 cm No Foreign Bodies seen or palpated Anesthesia: local infiltration Local anesthetic: lidocaine 1% with epinephrine Anesthetic total:  .85ml Irrigation method: syringe Amount of cleaning: standard Skin closure: 5-0 Prolene Number of sutures: 2 Technique: Simple interrupted Patient tolerance: Patient tolerated the  procedure well with no immediate complications.   Ottie Glazier, PA-C 07/27/15 1004

## 2015-07-28 ENCOUNTER — Inpatient Hospital Stay (HOSPITAL_COMMUNITY): Payer: Medicaid Other

## 2015-07-28 LAB — CBC
HCT: 31.5 % — ABNORMAL LOW (ref 39.0–52.0)
Hemoglobin: 10.4 g/dL — ABNORMAL LOW (ref 13.0–17.0)
MCH: 30.7 pg (ref 26.0–34.0)
MCHC: 33 g/dL (ref 30.0–36.0)
MCV: 92.9 fL (ref 78.0–100.0)
PLATELETS: 145 10*3/uL — AB (ref 150–400)
RBC: 3.39 MIL/uL — AB (ref 4.22–5.81)
RDW: 13.3 % (ref 11.5–15.5)
WBC: 6.2 10*3/uL (ref 4.0–10.5)

## 2015-07-28 LAB — BASIC METABOLIC PANEL
ANION GAP: 7 (ref 5–15)
BUN: 5 mg/dL — AB (ref 6–20)
CALCIUM: 8 mg/dL — AB (ref 8.9–10.3)
CO2: 25 mmol/L (ref 22–32)
Chloride: 105 mmol/L (ref 101–111)
Creatinine, Ser: 0.93 mg/dL (ref 0.61–1.24)
GFR calc Af Amer: >60 mL/min (ref >60)
GLUCOSE: 111 mg/dL — AB (ref 65–99)
POTASSIUM: 4.1 mmol/L (ref 3.5–5.1)
SODIUM: 137 mmol/L (ref 135–145)

## 2015-07-28 MED ORDER — SODIUM CHLORIDE 0.9 % IJ SOLN: 3.0000 mL | INTRAMUSCULAR | Status: DC | PRN

## 2015-07-28 MED ORDER — OXYCODONE HCL 5 MG PO TABS
10.0000 mg | ORAL_TABLET | ORAL | Status: DC | PRN
  Administered 2015-07-28 – 2015-07-31 (×15): 15 mg via ORAL
  Filled 2015-07-28 (×17): qty 3

## 2015-07-28 NOTE — Progress Notes (Signed)
Attempted to call report.  RN unavailable to take report.  She is to call back.
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
## 2015-07-28 NOTE — Progress Notes (Signed)
Met with pt to discuss dc planning.  Pt states PTA, pt lived alone, but states fiance will come and stay with him to assist at discharge.  Pt states he has been working "day work", and plans to get a Chief Executive Officer to assist with his case.  Await PT/OT recommendations; will follow progress.    Reinaldo Raddle, RN, BSN  Trauma/Neuro ICU Case Manager [phone]

## 2015-07-28 NOTE — Progress Notes (Signed)
Rehab Admissions Coordinator Note:  Patient was screened by Retta Diones for appropriateness for an Inpatient Acute Rehab Consult.  At this time, we are recommending Inpatient Rehab consult.  May want to wait to order consult until after any pending surgeries are completed.  Call me for questions.  Retta Diones 07/28/2015, 4:37 PM  I can be reached at [phone].
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
## 2015-07-28 NOTE — Progress Notes (Signed)
Attempted to call report.  RN unavailable to take report.  Will try back.
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
## 2015-07-30 MED ORDER — MAGNESIUM HYDROXIDE 400 MG/5ML PO SUSP
30.0000 mL | Freq: Every day | ORAL | Status: DC | PRN
  Administered 2015-07-30: 30 mL via ORAL
  Filled 2015-07-30: qty 30
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
## 2015-07-30 NOTE — Progress Notes (Signed)
Patient ID: Wesley Prince, male   DOB: [birth year], 55 y.o.   MRN: [id number]  [facility]  HD#: 4  Subjective: Patient brighter, more alert.  Some pain in knee and hand.  In hard collar.  Objective: Vital signs in last 24 hours: Temp:  [100 F (37.8 C)-100.1 F (37.8 C)] 100.1 F (37.8 C) (01/14 2052) Pulse Rate:  [83-91] 91 (01/14 2052) Resp:  [16-17] 16 (01/14 2052) BP: (122-123)/(67-68) 123/67 mmHg (01/14 2052) SpO2:  [94 %-100 %] 94 % (01/14 2052) Last BM Date: 07/27/15  Intake/Output from previous day: 01/14 0701 - 01/15 0700 In: 1200 [P.O.:1200] Out: 2700 [Urine:2700] Intake/Output this shift:    Physical Exam: HEENT - sclerae clear, mucous membranes moist Neck - hard collar in place Chest - clear bilaterally Cor - RRR Abdomen - soft without distension; BS present Ext - straight leg brace right knee, some soft tissue edema at knee; right hand in splint Neuro - alert & oriented, no focal deficits  Lab Results:   Recent Labs  07/28/15 0253  WBC 6.2  HGB 10.4*  HCT 31.5*  PLT 145*   BMET  Recent Labs  07/28/15 0253  NA 137  K 4.1  CL 105  CO2 25  GLUCOSE 111*  BUN 5*  CREATININE 0.93  CALCIUM 8.0*   PT/INR No results for input(s): LABPROT, INR in the last 72 hours. Comprehensive Metabolic Panel:    Component Value Date/Time   NA 137 07/28/2015 0253   NA 142 07/27/2015 0637   K 4.1 07/28/2015 0253   K 3.5 07/27/2015 0637   CL 105 07/28/2015 0253   CL 108 07/27/2015 0637   CO2 25 07/28/2015 0253   CO2 21* 07/27/2015 0610   BUN 5* 07/28/2015 0253   BUN 8 07/27/2015 0637   CREATININE 0.93 07/28/2015 0253   CREATININE 0.90 07/27/2015 0637   GLUCOSE 111* 07/28/2015 0253   GLUCOSE 96 07/27/2015 0637   CALCIUM 8.0* 07/28/2015 0253   CALCIUM 8.9 07/27/2015 0610   AST 43* 07/27/2015 0610   ALT 36 07/27/2015 0610   ALKPHOS 35* 07/27/2015 0610   BILITOT 0.7 07/27/2015 0610   PROT 6.3* 07/27/2015 0610    ALBUMIN 3.4* 07/27/2015 0610    Studies/Results: No results found.  Anti-infectives: Anti-infectives    None      Assessment & Plans: Right knee ligamentous injury Knee immobilizer OR later this week per ortho - Dr. Fredonia Highland Cervical spine injury Hard collar per Dr. Christella Noa Right hand fracture Splint Disposition Case management to assist - probably home after knee surgery  Earnstine Regal, MD, [facility] Office: [place] 07/30/2015

## 2015-07-31 DIAGNOSIS — S12500A Unspecified displaced fracture of sixth cervical vertebra, initial encounter for closed fracture: Secondary | ICD-10-CM | POA: Diagnosis present

## 2015-07-31 DIAGNOSIS — S62309A Unspecified fracture of unspecified metacarpal bone, initial encounter for closed fracture: Secondary | ICD-10-CM | POA: Diagnosis present

## 2015-07-31 DIAGNOSIS — S83104A Unspecified dislocation of right knee, initial encounter: Secondary | ICD-10-CM | POA: Diagnosis present

## 2015-07-31 DIAGNOSIS — S060X9A Concussion with loss of consciousness of unspecified duration, initial encounter: Secondary | ICD-10-CM | POA: Diagnosis present

## 2015-07-31 DIAGNOSIS — S2241XA Multiple fractures of ribs, right side, initial encounter for closed fracture: Secondary | ICD-10-CM | POA: Diagnosis present

## 2015-07-31 DIAGNOSIS — S060XAA Concussion with loss of consciousness status unknown, initial encounter: Secondary | ICD-10-CM | POA: Diagnosis present

## 2015-07-31 DIAGNOSIS — S0181XA Laceration without foreign body of other part of head, initial encounter: Secondary | ICD-10-CM | POA: Diagnosis present

## 2015-07-31 MED ORDER — ENOXAPARIN SODIUM 40 MG/0.4ML ~~LOC~~ SOLN
40.0000 mg | SUBCUTANEOUS | Status: DC
  Administered 2015-07-31: 40 mg via SUBCUTANEOUS
  Filled 2015-07-31: qty 0.4

## 2015-07-31 MED ORDER — OXYCODONE HCL 5 MG PO TABS
10.0000 mg | ORAL_TABLET | ORAL | Status: DC | PRN
Start: 2015-07-31 — End: 2015-08-01
  Administered 2015-07-31 – 2015-08-01 (×6): 20 mg via ORAL
  Filled 2015-07-31 (×6): qty 4

## 2015-07-31 MED ORDER — HYDROMORPHONE HCL 1 MG/ML IJ SOLN
0.5000 mg | INTRAMUSCULAR | Status: DC | PRN
  Administered 2015-07-31 (×2): 0.5 mg via INTRAVENOUS
  Filled 2015-07-31 (×2): qty 1

## 2015-07-31 MED ORDER — BACITRACIN-NEOMYCIN-POLYMYXIN OINTMENT TUBE
TOPICAL_OINTMENT | Freq: Two times a day (BID) | CUTANEOUS | Status: DC
  Administered 2015-07-31: 1 via TOPICAL
  Administered 2015-07-31 – 2015-08-01 (×2): via TOPICAL
  Filled 2015-07-31: qty 15
  Filled 2015-07-31: qty 1

## 2015-07-31 MED ORDER — TRAMADOL HCL 50 MG PO TABS
100.0000 mg | ORAL_TABLET | Freq: Four times a day (QID) | ORAL | Status: DC
  Administered 2015-07-31 – 2015-08-01 (×5): 100 mg via ORAL
  Filled 2015-07-31 (×5): qty 2

## 2015-07-31 MED ORDER — DOCUSATE SODIUM 100 MG PO CAPS
200.0000 mg | ORAL_CAPSULE | Freq: Two times a day (BID) | ORAL | Status: DC
  Administered 2015-07-31 – 2015-08-01 (×3): 200 mg via ORAL
  Filled 2015-07-31 (×3): qty 2

## 2015-07-31 MED ORDER — MAGNESIUM CITRATE PO SOLN
1.0000 | Freq: Every day | ORAL | Status: DC | PRN
  Filled 2015-07-31: qty 296

## 2015-07-31 MED ORDER — POLYETHYLENE GLYCOL 3350 17 G PO PACK
17.0000 g | PACK | Freq: Every day | ORAL | Status: DC
  Administered 2015-07-31: 17 g via ORAL
  Filled 2015-07-31: qty 1
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
## 2015-07-31 NOTE — Progress Notes (Signed)
Patient A/O, no noted distress. Tolerated meds well. Administered prn pain management, effective see EMar. Patient transfers to bed from chair with walker successfully. Staff will continue to monitor and meet needs.
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
## 2015-08-01 MED ORDER — OXYCODONE-ACETAMINOPHEN 10-325 MG PO TABS: 1.0000 | ORAL_TABLET | ORAL | Status: DC | PRN

## 2015-08-01 MED ORDER — TRAMADOL HCL 50 MG PO TABS: 100.0000 mg | ORAL_TABLET | Freq: Four times a day (QID) | ORAL | Status: DC
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
## 2015-08-01 NOTE — Progress Notes (Signed)
Patient and family verbalized understanding of discharge instructions including follow up appointments and prescriptions.  Prescriptions given to patients brother.  DME in room.  Patient awaiting family to pick him up.  Appears to be in no immediate distress at this time.  Brita Romp, RN
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
## 2015-08-01 NOTE — Discharge Instructions (Signed)
R leg in the knee immobilizer at all times.  Will see back in the office Friday, 1/20, for surgical planning.   No driving.  Keep collar on at all times.
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
## 2015-08-01 NOTE — Care Management Note (Signed)
Case Management Note  Patient Details  Name: Wesley Prince MRN: [id number] Date of Birth: March 25, 1961  Subjective/Objective:   Pt for dc home today with family/brother to assist.  Pt is uninsured; needs HH and DME.  Pt is eligible for medication assistance through [facility] program.                     Action/Plan: [facility] letter given to pt with explanation of program benefits.  Pt eligible for HHPT and DME through [facility] charity program.  Will provide HHPT and DME as ordered.  DME delivered to pt's room prior to dc.   Expected Discharge Date:     08/01/15             Expected Discharge Plan:  [place]  In-House Referral:     Discharge planning Services  CM Consult, [place] Program, Medication Assistance  Post Acute Care Choice:  Home Health Choice offered to:  Patient  DME Arranged:  3-N-1, Walker platform, Tub bench DME Agency:  [place]:  PT [place] Agency:  [place]  Status of Service:  Completed, signed off  Medicare Important Message Given:    Date Medicare IM Given:    Medicare IM give by:    Date Additional Medicare IM Given:    Additional Medicare Important Message give by:     If discussed at [place] of Stay Meetings, dates discussed:    Additional Comments:  Reinaldo Raddle, RN, BSN  Trauma/Neuro ICU Case Manager [phone]

## 2015-08-03 ENCOUNTER — Telehealth (HOSPITAL_COMMUNITY): Payer: Self-pay | Admitting: Orthopedic Surgery

## 2015-08-03 NOTE — Telephone Encounter (Signed)
Authorized HHSW consult for transportation issues, etc.

## 2015-08-04 ENCOUNTER — Encounter (HOSPITAL_COMMUNITY): Payer: Self-pay

## 2015-08-14 ENCOUNTER — Encounter (HOSPITAL_BASED_OUTPATIENT_CLINIC_OR_DEPARTMENT_OTHER): Payer: Self-pay | Admitting: *Deleted

## 2015-08-14 ENCOUNTER — Encounter (HOSPITAL_COMMUNITY): Payer: Self-pay | Admitting: *Deleted

## 2015-08-14 ENCOUNTER — Emergency Department (HOSPITAL_COMMUNITY)
Admission: EM | Admit: 2015-08-14 | Discharge: 2015-08-14 | Disposition: A | Discharge status: Home / Self Care | Payer: Medicaid Other | Attending: Emergency Medicine | Admitting: Emergency Medicine

## 2015-08-14 ENCOUNTER — Emergency Department (HOSPITAL_COMMUNITY): Payer: Medicaid Other

## 2015-08-14 DIAGNOSIS — Z791 Long term (current) use of non-steroidal anti-inflammatories (NSAID): Secondary | ICD-10-CM | POA: Insufficient documentation

## 2015-08-14 DIAGNOSIS — Z8719 Personal history of other diseases of the digestive system: Secondary | ICD-10-CM | POA: Diagnosis not present

## 2015-08-14 DIAGNOSIS — S8991XA Unspecified injury of right lower leg, initial encounter: Secondary | ICD-10-CM | POA: Diagnosis not present

## 2015-08-14 DIAGNOSIS — Z87442 Personal history of urinary calculi: Secondary | ICD-10-CM | POA: Insufficient documentation

## 2015-08-14 DIAGNOSIS — R0602 Shortness of breath: Secondary | ICD-10-CM | POA: Diagnosis not present

## 2015-08-14 DIAGNOSIS — S199XXA Unspecified injury of neck, initial encounter: Secondary | ICD-10-CM | POA: Diagnosis present

## 2015-08-14 DIAGNOSIS — Y92410 Unspecified street and highway as the place of occurrence of the external cause: Secondary | ICD-10-CM | POA: Insufficient documentation

## 2015-08-14 DIAGNOSIS — Z79899 Other long term (current) drug therapy: Secondary | ICD-10-CM | POA: Diagnosis not present

## 2015-08-14 DIAGNOSIS — Y998 Other external cause status: Secondary | ICD-10-CM | POA: Diagnosis not present

## 2015-08-14 DIAGNOSIS — S12500A Unspecified displaced fracture of sixth cervical vertebra, initial encounter for closed fracture: Secondary | ICD-10-CM

## 2015-08-14 DIAGNOSIS — S2241XA Multiple fractures of ribs, right side, initial encounter for closed fracture: Secondary | ICD-10-CM

## 2015-08-14 DIAGNOSIS — Y9389 Activity, other specified: Secondary | ICD-10-CM | POA: Diagnosis not present

## 2015-08-14 DIAGNOSIS — Z8739 Personal history of other diseases of the musculoskeletal system and connective tissue: Secondary | ICD-10-CM | POA: Insufficient documentation

## 2015-08-14 MED ORDER — HYDROMORPHONE HCL 1 MG/ML IJ SOLN
1.0000 mg | Freq: Once | INTRAMUSCULAR | Status: AC
Start: 2015-08-14 — End: 2015-08-14
  Administered 2015-08-14: 1 mg via INTRAVENOUS
  Filled 2015-08-14: qty 1

## 2015-08-14 MED ORDER — OXYCODONE-ACETAMINOPHEN 10-325 MG PO TABS: 1.0000 | ORAL_TABLET | Freq: Four times a day (QID) | ORAL | Status: DC | PRN

## 2015-08-14 MED ORDER — HYDROMORPHONE HCL 1 MG/ML IJ SOLN
1.0000 mg | Freq: Once | INTRAMUSCULAR | Status: AC
  Administered 2015-08-14: 1 mg via INTRAVENOUS
  Filled 2015-08-14: qty 1

## 2015-08-14 NOTE — ED Notes (Signed)
Patient transported to CT 
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
## 2015-08-14 NOTE — H&P (Signed)
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 Symptoms are rated as moderate to severe, and have been worsening.  This is significantly impairing activities of daily living.  He has elected for surgical management.   Past Medical History  Diagnosis Date  . Kidney stones   . Gastric ulcer   . Plantar fasciitis    Past Surgical History  Procedure Laterality Date  . Mandible fracture surgery    . Cyst removed from coccyx     Social History   Social History  . Marital Status: Single    Spouse Name: N/A  . Number of Children: N/A  . Years of Education: N/A   Social History Main Topics  . Smoking status: Never Smoker   . Smokeless tobacco: Not on file  . Alcohol Use: No  . Drug Use: Yes    Special: Marijuana, Cocaine  . Sexual Activity: Not on file   Other Topics Concern  . Not on file   Social History Narrative   ** Merged History Encounter **       Family History  Problem Relation Age of Onset  . CAD Mother   . Hypertension Mother    No Known Allergies Prior to Admission medications   Medication Sig Start Date End Date Taking? Authorizing Provider  cephALEXin (KEFLEX) 500 MG capsule Take 1 capsule (500 mg total) by mouth 4 (four) times daily. 02/08/15   Tatyana Kirichenko, PA-C  HYDROcodone-acetaminophen (NORCO) 5-325 MG per tablet Take 1-2 tablets by mouth every 4 (four) hours as needed for pain. Patient not taking: Reported on 02/08/2015 05/22/12   Margarita Mail, PA-C  meloxicam (MOBIC) 15 MG tablet Take 1 tablet (15 mg total) by mouth daily. With food. Patient not taking: Reported on 02/08/2015 05/22/12   Margarita Mail, PA-C  oxyCODONE-acetaminophen (PERCOCET) 10-325 MG tablet Take 1-2 tablets by mouth every 4  (four) hours as needed for pain. 08/01/15   Lisette Abu, PA-C  Sildenafil Citrate (VIAGRA PO) Take by mouth once.    Historical Provider, MD  traMADol (ULTRAM) 50 MG tablet Take 1 tablet (50 mg total) by mouth every 6 (six) hours as needed. 02/08/15   Tatyana Kirichenko, PA-C  traMADol (ULTRAM) 50 MG tablet Take 2 tablets (100 mg total) by mouth every 6 (six) hours. 08/01/15   Lisette Abu, PA-C     Positive ROS: All other systems have been reviewed and were otherwise negative with the exception of those mentioned in the HPI and as above.  Physical Exam: General: Alert, no acute distress Cardiovascular: No pedal edema Respiratory: No cyanosis, no use of accessory musculature GI: No organomegaly, abdomen is soft and non-tender Skin: No lesions in the area of chief complaint Neurologic: Sensation intact distally Psychiatric: Patient is competent for consent with normal mood and affect Lymphatic: No axillary or cervical lymphadenopathy  MUSCULOSKELETAL:  R knee has moderate swelling and ecchymosis.  ROM limited due to pain. Laxity with lauchman. Sensation intact with 2+ distal pulses.   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   Gae Dry, PA-C  08/14/2015 8:59 AM

## 2015-08-14 NOTE — Discharge Instructions (Signed)
Wear your cervical collar all of the time. Follow up with Ortho and Neurosurgery as planned.  Cervical Collar A cervical collar is a device that supports your chin and the back of your head. It is used after a severe neck injury to protect your head and neck. It does this by restricting the movement of the top part of your spine, which is located in your neck. A cervical collar may be used when you have:  A fractured neck.  Ligament damage.  A spinal cord injury. WHAT INSTRUCTIONS SHOULD I FOLLOW?  Wear the collar for as long as your health care provider instructs.  Follow your health care provider's instructions about how to put on and take off your collar.  Do not make your collar so tight that you feel pain or it is hard for you to breathe.  Do not remove the collar unless your health care provider says it is okay. Ask your health care provider if you can remove the collar for showering or eating or to apply ice.  Do not drive a car until your health care provider says it is okay.  Keep all follow-up visits as directed by your health care provider. This is important. Any delay in getting necessary care can keep your injury from healing properly.  Apply ice to the injured area:  Put ice in a plastic bag.  Place a towel between your skin and the bag.  Leave the ice on for 20 minutes, 2-3 times per day for the first 2 days.   This information is not intended to replace advice given to you by your health care provider. Make sure you discuss any questions you have with your health care provider.   Document Released: 03/23/2004 Document Revised: 07/22/2014 Document Reviewed: 02/07/2014 Elsevier Interactive Patient Education 2016 [employer].  Rib Fracture A rib fracture is a break or crack in one of the bones of the ribs. The ribs are a group of long, curved bones that wrap around your chest and attach to your spine. They protect your lungs and other organs in the chest cavity. A  broken or cracked rib is often painful, but most do not cause other problems. Most rib fractures heal on their own over time. However, rib fractures can be more serious if multiple ribs are broken or if broken ribs move out of place and push against other structures. CAUSES   A direct blow to the chest. For example, this could happen during contact sports, a car accident, or a fall against a hard object.  Repetitive movements with high force, such as pitching a baseball or having severe coughing spells. SYMPTOMS   Pain when you breathe in or cough.  Pain when someone presses on the injured area. DIAGNOSIS  Your caregiver will perform a physical exam. Various imaging tests may be ordered to confirm the diagnosis and to look for related injuries. These tests may include a chest X-ray, computed tomography (CT), magnetic resonance imaging (MRI), or a bone scan. TREATMENT  Rib fractures usually heal on their own in 1-3 months. The longer healing period is often associated with a continued cough or other aggravating activities. During the healing period, pain control is very important. Medication is usually given to control pain. Hospitalization or surgery may be needed for more severe injuries, such as those in which multiple ribs are broken or the ribs have moved out of place.  HOME CARE INSTRUCTIONS   Avoid strenuous activity and any activities or movements  that cause pain. Be careful during activities and avoid bumping the injured rib.  Gradually increase activity as directed by your caregiver.  Only take over-the-counter or prescription medications as directed by your caregiver. Do not take other medications without asking your caregiver first.  Apply ice to the injured area for the first 1-2 days after you have been treated or as directed by your caregiver. Applying ice helps to reduce inflammation and pain.  Put ice in a plastic bag.  Place a towel between your skin and the bag.    Leave the ice on for 15-20 minutes at a time, every 2 hours while you are awake.  Perform deep breathing as directed by your caregiver. This will help prevent pneumonia, which is a common complication of a broken rib. Your caregiver may instruct you to:  Take deep breaths several times a day.  Try to cough several times a day, holding a pillow against the injured area.  Use a device called an incentive spirometer to practice deep breathing several times a day.  Drink enough fluids to keep your urine clear or pale yellow. This will help you avoid constipation.   Do not wear a rib belt or binder. These restrict breathing, which can lead to pneumonia.  SEEK IMMEDIATE MEDICAL CARE IF:   You have a fever.   You have difficulty breathing or shortness of breath.   You develop a continual cough, or you cough up thick or bloody sputum.  You feel sick to your stomach (nausea), throw up (vomit), or have abdominal pain.   You have worsening pain not controlled with medications.  MAKE SURE YOU:  Understand these instructions.  Will watch your condition.  Will get help right away if you are not doing well or get worse.   This information is not intended to replace advice given to you by your health care provider. Make sure you discuss any questions you have with your health care provider.   Document Released: 07/01/2005 Document Revised: 03/03/2013 Document Reviewed: 09/02/2012 Elsevier Interactive Patient Education [employer].

## 2015-08-14 NOTE — ED Notes (Signed)
Patient given a warm blanket; visitor at bedside eating food

## 2015-08-14 NOTE — Progress Notes (Signed)
CSW engaged with Patient at his bedside with Patient's girlfriend present. Patient's girlfriend reports that Patient is currently receiving services with [facility] ([place] Social Worker [phone]). Patient's girlfriend reports that she is having difficulty lifting him and reports that he is in need of a bed and wheelchair. She reports that he currently has a shower chair, walker, commode, and transfer bench. CSW staffed with Case Manager who will further investigate home health needs. CSW signing off as social work need have been addressed. Please contact if new need(s) arise.    Isac S[facility]/ [place] Social Worker [phone]
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
## 2015-08-14 NOTE — ED Notes (Signed)
Myself and Ellison Hughs, RN readjusted patient on stretcher; visitor at bedside

## 2015-08-14 NOTE — ED Notes (Signed)
Patient undressed, in gown, on monitor, continuous pulse oximetry and blood pressure cuff; visitor at bedside 

## 2015-08-17 ENCOUNTER — Encounter (HOSPITAL_BASED_OUTPATIENT_CLINIC_OR_DEPARTMENT_OTHER)
Admission: RE | Disposition: A | Discharge status: Home / Self Care | Payer: Self-pay | Source: Ambulatory Visit | Attending: Orthopedic Surgery

## 2015-08-17 ENCOUNTER — Ambulatory Visit (HOSPITAL_BASED_OUTPATIENT_CLINIC_OR_DEPARTMENT_OTHER): Payer: Medicaid Other | Admitting: Anesthesiology

## 2015-08-17 ENCOUNTER — Encounter (HOSPITAL_BASED_OUTPATIENT_CLINIC_OR_DEPARTMENT_OTHER): Payer: Self-pay

## 2015-08-17 ENCOUNTER — Ambulatory Visit (HOSPITAL_BASED_OUTPATIENT_CLINIC_OR_DEPARTMENT_OTHER)
Admission: RE | Admit: 2015-08-17 | Discharge: 2015-08-18 | Disposition: A | Discharge status: Home / Self Care | Payer: Medicaid Other | Source: Ambulatory Visit | Attending: Orthopedic Surgery | Admitting: Orthopedic Surgery

## 2015-08-17 DIAGNOSIS — F17200 Nicotine dependence, unspecified, uncomplicated: Secondary | ICD-10-CM | POA: Diagnosis not present

## 2015-08-17 DIAGNOSIS — M23203 Derangement of unspecified medial meniscus due to old tear or injury, right knee: Secondary | ICD-10-CM | POA: Insufficient documentation

## 2015-08-17 DIAGNOSIS — M23611 Other spontaneous disruption of anterior cruciate ligament of right knee: Secondary | ICD-10-CM | POA: Insufficient documentation

## 2015-08-17 DIAGNOSIS — M23200 Derangement of unspecified lateral meniscus due to old tear or injury, right knee: Secondary | ICD-10-CM | POA: Diagnosis not present

## 2015-08-17 DIAGNOSIS — S83429A Sprain of lateral collateral ligament of unspecified knee, initial encounter: Secondary | ICD-10-CM | POA: Diagnosis present

## 2015-08-17 HISTORY — PX: NERVE EXPLORATION: SHX2082

## 2015-08-17 HISTORY — PX: PATELLAR TENDON REPAIR: SHX737

## 2015-08-17 HISTORY — PX: KNEE ARTHROSCOPY WITH LATERAL MENISECTOMY: SHX6193

## 2015-08-17 HISTORY — DX: Reserved for inherently not codable concepts without codable children: IMO0001

## 2015-08-17 HISTORY — PX: ARTHROSCOPY WITH ANTERIOR CRUCIATE LIGAMENT (ACL) REPAIR WITH ANTERIOR TIBILIAS GRAFT: SHX6503

## 2015-08-17 SURGERY — KNEE ARTHROSCOPY WITH ANTERIOR CRUCIATE LIGAMENT (ACL) REPAIR WITH ANTERIOR TIBILIAS GRAFT
Anesthesia: General | Site: Knee | Laterality: Right

## 2015-08-17 MED ORDER — ONDANSETRON HCL 4 MG/2ML IJ SOLN
INTRAMUSCULAR | Status: AC
  Filled 2015-08-17: qty 2

## 2015-08-17 MED ORDER — POTASSIUM CHLORIDE IN NACL 20-0.45 MEQ/L-% IV SOLN: INTRAVENOUS | Status: DC

## 2015-08-17 MED ORDER — LIDOCAINE HCL (CARDIAC) 20 MG/ML IV SOLN
INTRAVENOUS | Status: DC | PRN
  Administered 2015-08-17: 80 mg via INTRAVENOUS

## 2015-08-17 MED ORDER — ACETAMINOPHEN 500 MG PO TABS
1000.0000 mg | ORAL_TABLET | Freq: Once | ORAL | Status: AC
  Administered 2015-08-17: 1000 mg via ORAL

## 2015-08-17 MED ORDER — METOCLOPRAMIDE HCL 5 MG PO TABS: 5.0000 mg | ORAL_TABLET | Freq: Three times a day (TID) | ORAL | Status: DC | PRN

## 2015-08-17 MED ORDER — HYDROMORPHONE HCL 1 MG/ML IJ SOLN
0.2500 mg | INTRAMUSCULAR | Status: DC | PRN
  Administered 2015-08-17 (×4): 0.5 mg via INTRAVENOUS

## 2015-08-17 MED ORDER — FENTANYL CITRATE (PF) 100 MCG/2ML IJ SOLN
INTRAMUSCULAR | Status: AC
  Filled 2015-08-17: qty 2

## 2015-08-17 MED ORDER — ACETAMINOPHEN 650 MG RE SUPP: 650.0000 mg | Freq: Four times a day (QID) | RECTAL | Status: DC | PRN

## 2015-08-17 MED ORDER — METOCLOPRAMIDE HCL 5 MG/ML IJ SOLN: 5.0000 mg | Freq: Three times a day (TID) | INTRAMUSCULAR | Status: DC | PRN

## 2015-08-17 MED ORDER — CEFAZOLIN SODIUM-DEXTROSE 2-3 GM-% IV SOLR
INTRAVENOUS | Status: AC
  Filled 2015-08-17: qty 50

## 2015-08-17 MED ORDER — HYDROMORPHONE HCL 1 MG/ML IJ SOLN
0.5000 mg | INTRAMUSCULAR | Status: DC | PRN
  Administered 2015-08-17: 0.5 mg via INTRAVENOUS

## 2015-08-17 MED ORDER — SODIUM CHLORIDE 0.9 % IR SOLN
Status: DC | PRN
  Administered 2015-08-17: 5000 mL

## 2015-08-17 MED ORDER — MIDAZOLAM HCL 2 MG/2ML IJ SOLN
INTRAMUSCULAR | Status: AC
  Filled 2015-08-17: qty 2

## 2015-08-17 MED ORDER — DEXAMETHASONE SODIUM PHOSPHATE 4 MG/ML IJ SOLN
INTRAMUSCULAR | Status: DC | PRN
  Administered 2015-08-17: 10 mg via INTRAVENOUS

## 2015-08-17 MED ORDER — DOCUSATE SODIUM 100 MG PO CAPS
100.0000 mg | ORAL_CAPSULE | Freq: Two times a day (BID) | ORAL | Status: DC
  Administered 2015-08-17: 100 mg via ORAL
  Filled 2015-08-17: qty 1

## 2015-08-17 MED ORDER — DOCUSATE SODIUM 100 MG PO CAPS: 100.0000 mg | ORAL_CAPSULE | Freq: Two times a day (BID) | ORAL | Status: DC

## 2015-08-17 MED ORDER — CEFAZOLIN SODIUM-DEXTROSE 2-3 GM-% IV SOLR
2.0000 g | INTRAVENOUS | Status: AC
  Administered 2015-08-17: 2 g via INTRAVENOUS

## 2015-08-17 MED ORDER — PROMETHAZINE HCL 25 MG/ML IJ SOLN
6.2500 mg | INTRAMUSCULAR | Status: DC | PRN
  Filled 2015-08-17: qty 1

## 2015-08-17 MED ORDER — ONDANSETRON HCL 4 MG/2ML IJ SOLN
INTRAMUSCULAR | Status: DC | PRN
  Administered 2015-08-17: 4 mg via INTRAVENOUS

## 2015-08-17 MED ORDER — MEPERIDINE HCL 25 MG/ML IJ SOLN: 6.2500 mg | INTRAMUSCULAR | Status: DC | PRN

## 2015-08-17 MED ORDER — HYDROMORPHONE HCL 1 MG/ML IJ SOLN
1.0000 mg | INTRAMUSCULAR | Status: DC | PRN
  Administered 2015-08-17 – 2015-08-18 (×4): 1 mg via INTRAVENOUS
  Filled 2015-08-17 (×4): qty 1

## 2015-08-17 MED ORDER — MIDAZOLAM HCL 2 MG/2ML IJ SOLN
1.0000 mg | INTRAMUSCULAR | Status: DC | PRN
  Administered 2015-08-17: 2 mg via INTRAVENOUS

## 2015-08-17 MED ORDER — ONDANSETRON HCL 4 MG/2ML IJ SOLN: 4.0000 mg | Freq: Four times a day (QID) | INTRAMUSCULAR | Status: DC | PRN

## 2015-08-17 MED ORDER — BUPIVACAINE-EPINEPHRINE (PF) 0.5% -1:200000 IJ SOLN
INTRAMUSCULAR | Status: DC | PRN
  Administered 2015-08-17: 30 mL via PERINEURAL

## 2015-08-17 MED ORDER — CEFAZOLIN SODIUM-DEXTROSE 2-3 GM-% IV SOLR
2.0000 g | Freq: Four times a day (QID) | INTRAVENOUS | Status: AC
  Administered 2015-08-17 – 2015-08-18 (×3): 2 g via INTRAVENOUS
  Filled 2015-08-17 (×3): qty 50

## 2015-08-17 MED ORDER — HYDROMORPHONE HCL 1 MG/ML IJ SOLN
INTRAMUSCULAR | Status: AC
  Filled 2015-08-17: qty 1

## 2015-08-17 MED ORDER — ONDANSETRON HCL 4 MG PO TABS: 4.0000 mg | ORAL_TABLET | Freq: Three times a day (TID) | ORAL | Status: DC | PRN

## 2015-08-17 MED ORDER — ACETAMINOPHEN 325 MG PO TABS: 650.0000 mg | ORAL_TABLET | Freq: Four times a day (QID) | ORAL | Status: DC | PRN

## 2015-08-17 MED ORDER — ASPIRIN 325 MG PO TABS: 325.0000 mg | ORAL_TABLET | Freq: Every day | ORAL | Status: DC

## 2015-08-17 MED ORDER — OXYCODONE HCL 5 MG PO TABS
5.0000 mg | ORAL_TABLET | ORAL | Status: DC | PRN
  Administered 2015-08-17: 5 mg via ORAL
  Administered 2015-08-17: 10 mg via ORAL
  Filled 2015-08-17: qty 1
  Filled 2015-08-17: qty 2

## 2015-08-17 MED ORDER — SCOPOLAMINE 1 MG/3DAYS TD PT72
1.0000 | MEDICATED_PATCH | Freq: Once | TRANSDERMAL | Status: DC | PRN
Start: 2015-08-17 — End: 2015-08-17

## 2015-08-17 MED ORDER — OXYCODONE HCL 5 MG PO TABS: 5.0000 mg | ORAL_TABLET | ORAL | Status: DC | PRN

## 2015-08-17 MED ORDER — DIAZEPAM 5 MG PO TABS: 5.0000 mg | ORAL_TABLET | Freq: Four times a day (QID) | ORAL | Status: DC | PRN

## 2015-08-17 MED ORDER — FENTANYL CITRATE (PF) 100 MCG/2ML IJ SOLN
50.0000 ug | INTRAMUSCULAR | Status: DC | PRN
  Administered 2015-08-17 (×2): 100 ug via INTRAVENOUS

## 2015-08-17 MED ORDER — ONDANSETRON HCL 4 MG PO TABS: 4.0000 mg | ORAL_TABLET | Freq: Four times a day (QID) | ORAL | Status: DC | PRN

## 2015-08-17 MED ORDER — GLYCOPYRROLATE 0.2 MG/ML IJ SOLN: 0.2000 mg | Freq: Once | INTRAMUSCULAR | Status: DC | PRN

## 2015-08-17 MED ORDER — KETOROLAC TROMETHAMINE 30 MG/ML IJ SOLN
30.0000 mg | Freq: Once | INTRAMUSCULAR | Status: AC
  Administered 2015-08-17: 30 mg via INTRAVENOUS

## 2015-08-17 MED ORDER — POTASSIUM CHLORIDE IN NACL 20-0.45 MEQ/L-% IV SOLN
INTRAVENOUS | Status: DC
Start: 2015-08-17 — End: 2015-08-18
  Administered 2015-08-17: 16:00:00 via INTRAVENOUS

## 2015-08-17 MED ORDER — DIAZEPAM 5 MG PO TABS
5.0000 mg | ORAL_TABLET | Freq: Four times a day (QID) | ORAL | Status: DC | PRN
  Administered 2015-08-17 – 2015-08-18 (×3): 5 mg via ORAL
  Filled 2015-08-17 (×3): qty 1

## 2015-08-17 MED ORDER — BUPIVACAINE HCL (PF) 0.25 % IJ SOLN
INTRAMUSCULAR | Status: DC | PRN
  Administered 2015-08-17: 20 mL

## 2015-08-17 MED ORDER — LACTATED RINGERS IV SOLN
INTRAVENOUS | Status: DC
  Administered 2015-08-17 (×2): via INTRAVENOUS

## 2015-08-17 MED ORDER — PROPOFOL 10 MG/ML IV BOLUS
INTRAVENOUS | Status: DC | PRN
  Administered 2015-08-17: 200 mg via INTRAVENOUS

## 2015-08-17 MED ORDER — CHLORHEXIDINE GLUCONATE 4 % EX LIQD: 60.0000 mL | Freq: Once | CUTANEOUS | Status: DC

## 2015-08-17 MED ORDER — KETOROLAC TROMETHAMINE 30 MG/ML IJ SOLN
INTRAMUSCULAR | Status: AC
  Filled 2015-08-17: qty 1

## 2015-08-17 MED ORDER — LIDOCAINE HCL (CARDIAC) 20 MG/ML IV SOLN
INTRAVENOUS | Status: AC
  Filled 2015-08-17: qty 5

## 2015-08-17 MED ORDER — OXYCODONE HCL 5 MG PO TABS
5.0000 mg | ORAL_TABLET | ORAL | Status: DC | PRN
  Administered 2015-08-18 (×3): 10 mg via ORAL
  Filled 2015-08-17 (×3): qty 2

## 2015-08-17 MED ORDER — ACETAMINOPHEN 500 MG PO TABS
ORAL_TABLET | ORAL | Status: AC
  Filled 2015-08-17: qty 2

## 2015-08-17 MED ORDER — LACTATED RINGERS IV SOLN: INTRAVENOUS | Status: DC

## 2015-08-17 SURGICAL SUPPLY — 144 items
ANCH SUT 2 FT CRKSW 14.7X5.5 (Anchor) ×2 IMPLANT
ANCH SUT SWLK 19.1X5.5 CLS EL (Anchor) ×8 IMPLANT
ANCH SUT SWLK 19.1X6.25 CLS (Anchor) ×2 IMPLANT
ANCHOR CORKSCREW FIBER 5.5X15 (Anchor) ×3 IMPLANT
ANCHOR PEEK SWIVEL LOCK 5.5 (Anchor) ×12 IMPLANT
ANCHOR SUT SWIVELLOK BIO (Anchor) ×3 IMPLANT
APL SKNCLS STERI-STRIP NONHPOA (GAUZE/BANDAGES/DRESSINGS) ×2
BANDAGE ACE 4X5 VEL STRL LF (GAUZE/BANDAGES/DRESSINGS) ×3 IMPLANT
BANDAGE ACE 6X5 VEL STRL LF (GAUZE/BANDAGES/DRESSINGS) ×8 IMPLANT
BANDAGE ESMARK 6X9 LF (GAUZE/BANDAGES/DRESSINGS) ×2 IMPLANT
BENZOIN TINCTURE PRP APPL 2/3 (GAUZE/BANDAGES/DRESSINGS) ×4 IMPLANT
BLADE 4.2CUDA (BLADE) IMPLANT
BLADE CUDA 5.5 (BLADE) IMPLANT
BLADE CUDA GRT WHITE 3.5 (BLADE) IMPLANT
BLADE CUTTER GATOR 3.5 (BLADE) ×4 IMPLANT
BLADE CUTTER MENIS 5.5 (BLADE) IMPLANT
BLADE GREAT WHITE 4.2 (BLADE) ×3 IMPLANT
BLADE GREAT WHITE 4.2MM (BLADE) ×1
BLADE SURG 10 STRL SS (BLADE) IMPLANT
BLADE SURG 15 STRL LF DISP TIS (BLADE) ×3 IMPLANT
BLADE SURG 15 STRL SS (BLADE) ×4
BNDG CMPR 9X6 STRL LF SNTH (GAUZE/BANDAGES/DRESSINGS) ×2
BNDG ESMARK 6X9 LF (GAUZE/BANDAGES/DRESSINGS) ×4
BRUSH SCRUB EZ PLAIN DRY (MISCELLANEOUS) ×3 IMPLANT
BUR EGG 3PK/BX (BURR) ×1 IMPLANT
BUR OVAL 4.0 (BURR) IMPLANT
BUR OVAL 6.0 (BURR) ×4 IMPLANT
CHLORAPREP W/TINT 26ML (MISCELLANEOUS) ×4 IMPLANT
CLOSURE STERI-STRIP 1/2X4 (GAUZE/BANDAGES/DRESSINGS)
CLOSURE WOUND 1/2 X4 (GAUZE/BANDAGES/DRESSINGS) ×1
CLSR STERI-STRIP ANTIMIC 1/2X4 (GAUZE/BANDAGES/DRESSINGS) ×1 IMPLANT
COVER BACK TABLE 60X90IN (DRAPES) ×4 IMPLANT
CUFF TOURNIQUET SINGLE 34IN LL (TOURNIQUET CUFF) ×3 IMPLANT
CUTTER FLIP II 9.5MM (INSTRUMENTS) IMPLANT
CUTTER KNOT PUSHER 2-0 FIBERWI (INSTRUMENTS) IMPLANT
CUTTER MENISCUS  4.2MM (BLADE)
CUTTER MENISCUS 4.2MM (BLADE) IMPLANT
DECANTER SPIKE VIAL GLASS SM (MISCELLANEOUS) IMPLANT
DRAPE ARTHROSCOPY W/POUCH 90 (DRAPES) ×4 IMPLANT
DRAPE OEC MINIVIEW 54X84 (DRAPES) ×1 IMPLANT
DRAPE U-SHAPE 47X51 STRL (DRAPES) ×4 IMPLANT
DRAPE U-SHAPE 76X120 STRL (DRAPES) ×6 IMPLANT
DRILL FLIPCUTTER II 10.5MM (CUTTER) IMPLANT
DRILL FLIPCUTTER II 10MM (CUTTER) IMPLANT
DRILL FLIPCUTTER II 7.0MM (INSTRUMENTS) IMPLANT
DRILL FLIPCUTTER II 7.5MM (MISCELLANEOUS) IMPLANT
DRILL FLIPCUTTER II 8.0MM (INSTRUMENTS) IMPLANT
DRILL FLIPCUTTER II 8.5MM (INSTRUMENTS) IMPLANT
DRILL FLIPCUTTER II 9.0MM (INSTRUMENTS) IMPLANT
DRSG ADAPTIC 3X8 NADH LF (GAUZE/BANDAGES/DRESSINGS) ×3 IMPLANT
DRSG EMULSION OIL 3X3 NADH (GAUZE/BANDAGES/DRESSINGS) ×1 IMPLANT
DRSG PAD ABDOMINAL 8X10 ST (GAUZE/BANDAGES/DRESSINGS) ×4 IMPLANT
ELECT MENISCUS 165MM 90D (ELECTRODE) ×1 IMPLANT
ELECT REM PT RETURN 9FT ADLT (ELECTROSURGICAL) ×4
ELECTRODE REM PT RTRN 9FT ADLT (ELECTROSURGICAL) ×2 IMPLANT
FIBERSTICK 2 (SUTURE) ×3 IMPLANT
FLIP CUTTER II 7.0MM (INSTRUMENTS)
FLIPCUTTER II 10.5MM (CUTTER)
FLIPCUTTER II 10MM (CUTTER)
FLIPCUTTER II 7.5MM (MISCELLANEOUS)
FLIPCUTTER II 8.0MM (INSTRUMENTS)
FLIPCUTTER II 8.5MM (INSTRUMENTS)
FLIPCUTTER II 9.0MM (INSTRUMENTS)
GAUZE SPONGE 4X4 12PLY STRL (GAUZE/BANDAGES/DRESSINGS) ×4 IMPLANT
GAUZE SPONGE 4X4 16PLY XRAY LF (GAUZE/BANDAGES/DRESSINGS) ×4 IMPLANT
GAUZE XEROFORM 1X8 LF (GAUZE/BANDAGES/DRESSINGS) ×4 IMPLANT
GLOVE BIO SURGEON STRL SZ7 (GLOVE) ×4 IMPLANT
GLOVE BIO SURGEON STRL SZ7.5 (GLOVE) ×4 IMPLANT
GLOVE BIO SURGEON STRL SZ8 (GLOVE) ×1 IMPLANT
GLOVE BIOGEL PI IND STRL 7.0 (GLOVE) ×5 IMPLANT
GLOVE BIOGEL PI IND STRL 8 (GLOVE) ×2 IMPLANT
GLOVE BIOGEL PI INDICATOR 7.0 (GLOVE) ×8
GLOVE BIOGEL PI INDICATOR 8 (GLOVE) ×2
GLOVE SURG ORTHO 8.0 STRL STRW (GLOVE) ×4 IMPLANT
GORE SMOOTHER 7.9MM (MISCELLANEOUS) IMPLANT
GORE SMOOTHER 9.5 (MISCELLANEOUS) IMPLANT
GOWN STRL REUS W/ TWL LRG LVL3 (GOWN DISPOSABLE) ×5 IMPLANT
GOWN STRL REUS W/ TWL XL LVL3 (GOWN DISPOSABLE) ×3 IMPLANT
GOWN STRL REUS W/TWL LRG LVL3 (GOWN DISPOSABLE) ×12
GOWN STRL REUS W/TWL XL LVL3 (GOWN DISPOSABLE) ×8
GRAFT TISS ANT TIB TNDN (Tissue) IMPLANT
GUIDEPIN REAMER CUTTER 11MM (INSTRUMENTS) IMPLANT
IMMOBILIZER KNEE 22 UNIV (SOFTGOODS) IMPLANT
IMMOBILIZER KNEE 24 THIGH 36 (MISCELLANEOUS) IMPLANT
IMMOBILIZER KNEE 24 UNIV (MISCELLANEOUS)
IV NS IRRIG 3000ML ARTHROMATIC (IV SOLUTION) ×10 IMPLANT
KIT TRANSTIBIAL (DISPOSABLE) IMPLANT
KNEE WRAP E Z 3 GEL PACK (MISCELLANEOUS) ×1 IMPLANT
LOOP 2 FIBERLINK CLOSED (SUTURE) IMPLANT
MANIFOLD NEPTUNE II (INSTRUMENTS) ×4 IMPLANT
NDL SUT 6 .5 CRC .975X.05 MAYO (NEEDLE) ×1 IMPLANT
NEEDLE MAYO TAPER (NEEDLE) ×4
NS IRRIG 1000ML POUR BTL (IV SOLUTION) ×4 IMPLANT
PACK ARTHROSCOPY DSU (CUSTOM PROCEDURE TRAY) ×4 IMPLANT
PACK BASIN DAY SURGERY FS (CUSTOM PROCEDURE TRAY) ×4 IMPLANT
PAD CAST 4YDX4 CTTN HI CHSV (CAST SUPPLIES) ×2 IMPLANT
PADDING CAST ABS 4INX4YD NS (CAST SUPPLIES) ×8
PADDING CAST ABS 6INX4YD NS (CAST SUPPLIES) ×4
PADDING CAST ABS COTTON 4X4 ST (CAST SUPPLIES) ×4 IMPLANT
PADDING CAST ABS COTTON 6X4 NS (CAST SUPPLIES) ×2 IMPLANT
PADDING CAST COTTON 4X4 STRL (CAST SUPPLIES) ×4
PADDING CAST COTTON 6X4 STRL (CAST SUPPLIES) ×4 IMPLANT
PENCIL BUTTON HOLSTER BLD 10FT (ELECTRODE) ×4 IMPLANT
PIN DRILL ACL TIGHTROPE 4MM (PIN) IMPLANT
SCREW BIO INTER 8X23 (Screw) ×3 IMPLANT
SET ARTHROSCOPY TUBING (MISCELLANEOUS) ×4
SET ARTHROSCOPY TUBING LN (MISCELLANEOUS) ×2 IMPLANT
SHEET MEDIUM DRAPE 40X70 STRL (DRAPES) ×3 IMPLANT
SLEEVE SCD COMPRESS KNEE MED (MISCELLANEOUS) ×3 IMPLANT
SPLINT FAST PLASTER 5X30 (CAST SUPPLIES) ×60
SPLINT PLASTER CAST FAST 5X30 (CAST SUPPLIES) ×30 IMPLANT
SPONGE LAP 4X18 X RAY DECT (DISPOSABLE) ×8 IMPLANT
STRIP CLOSURE SKIN 1/2X4 (GAUZE/BANDAGES/DRESSINGS) ×4 IMPLANT
SUCTION FRAZIER HANDLE 10FR (MISCELLANEOUS) ×2
SUCTION TUBE FRAZIER 10FR DISP (MISCELLANEOUS) ×2 IMPLANT
SUT 2 FIBERLOOP 20 STRT BLUE (SUTURE) ×8
SUT ETHILON 3 0 PS 1 (SUTURE) ×22 IMPLANT
SUT FIBERWIRE #2 38 T-5 BLUE (SUTURE) ×8
SUT FIBERWIRE #5 38 CONV NDL (SUTURE)
SUT FIBERWIRE 2-0 18 17.9 3/8 (SUTURE)
SUT MNCRL AB 4-0 PS2 18 (SUTURE) ×3 IMPLANT
SUT MON AB 2-0 CT1 36 (SUTURE) ×4 IMPLANT
SUT PDS AB 0 CT 36 (SUTURE) ×3 IMPLANT
SUT VIC AB 0 CT1 27 (SUTURE) ×8
SUT VIC AB 0 CT1 27XBRD ANBCTR (SUTURE) ×2 IMPLANT
SUT VIC AB 2-0 SH 27 (SUTURE)
SUT VIC AB 2-0 SH 27XBRD (SUTURE) IMPLANT
SUT VIC AB 3-0 SH 27 (SUTURE)
SUT VIC AB 3-0 SH 27X BRD (SUTURE) IMPLANT
SUT VICRYL 4-0 PS2 18IN ABS (SUTURE) IMPLANT
SUTURE 2 FIBERLOOP 20 STRT BLU (SUTURE) ×4 IMPLANT
SUTURE FIBERWR #2 38 T-5 BLUE (SUTURE) ×2 IMPLANT
SUTURE FIBERWR #5 38 CONV NDL (SUTURE) IMPLANT
SUTURE FIBERWR 2-0 18 17.9 3/8 (SUTURE) IMPLANT
SUTURE TIGERSTICK 2 TIGERWIR 2 (MISCELLANEOUS) IMPLANT
SYR CONTROL 10ML LL (SYRINGE) IMPLANT
TAPE CLOTH 3X10 TAN LF (GAUZE/BANDAGES/DRESSINGS) ×4 IMPLANT
TENDON ANTERIOR TIBIALIS (Tissue) ×8 IMPLANT
TIGERSTICK 2 TIGERWIRE 2 (MISCELLANEOUS)
TOWEL OR 17X24 6PK STRL BLUE (TOWEL DISPOSABLE) ×8 IMPLANT
TOWEL OR NON WOVEN STRL DISP B (DISPOSABLE) ×4 IMPLANT
TRAY DSU PREP LF (CUSTOM PROCEDURE TRAY) ×3 IMPLANT
WAND STAR VAC 90 (SURGICAL WAND) ×4 IMPLANT
WATER STERILE IRR 1000ML POUR (IV SOLUTION) ×4 IMPLANT

## 2015-08-17 NOTE — Discharge Instructions (Signed)
Keep splint clean and dry till follow up   Non-weight bearing in the leg  Elevate to help control swelling and pain  ASA 325mg  daily for DVT prophylaxis for 30 days

## 2015-08-17 NOTE — Transfer of Care (Signed)
Immediate Anesthesia Transfer of Care Note  Patient: [name]  Procedure(s) Performed: Procedure(s): RIGHT KNEE ARTHROSCOPY, LATERAL MENISECTOMY,  POSTEROLATERAL CORNER RECONSTRUCTION WITH LATERAL COLLATERAL LIGAMENT RECONSTRUCTION USING ANTERIOR TIBILIAS GRAFT (Right) RIGHT PATELLA TENDON REPAIR (Right) KNEE ARTHROSCOPY WITH LATERAL MENISECTOMY (Right)  Patient Location: PACU  Anesthesia Type:GA combined with regional for post-op pain  Level of Consciousness: awake and patient cooperative  Airway & Oxygen Therapy: Patient Spontanous Breathing and Patient connected to face mask  Post-op Assessment: Report given to RN and Post -op Vital signs reviewed and stable  Post vital signs: Reviewed and stable  Last Vitals:  Filed Vitals:   08/17/15 0915 08/17/15 0920  BP: 119/70   Pulse: 80 78  Temp:    Resp: 14 11    Complications: No apparent anesthesia complications

## 2015-08-17 NOTE — Op Note (Signed)
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 male with a diagnosis of RIGHT KNEE SPONTANEOUS DISRUPTION OF ANTERIOR CRUCIATE LIGAMENT OF RIGHT KNEE who failed conservative measures and elected for surgical management.    The risks benefits and alternatives were discussed with the patient preoperatively including but not limited to the risks of infection, bleeding, nerve injury, cardiopulmonary complications, the need for revision surgery, among others, and the patient was willing to proceed.  OPERATIVE IMPLANTS: multiple swivel lock bioscrews. TA allograft tendon x 2  OPERATIVE FINDINGS: Severe instability to external rotation dial testing. Significant sagging of his tibia when he engaged his plateau. Instability with anterior and posterior drawer.  BLOOD LOSS: min  COMPLICATIONS: none  TOURNIQUET TIME: 176min  OPERATIVE PROCEDURE:  Patient was identified in the preoperative holding area and site was marked by me He was transported to the operating theater and placed on the table in supine position taking care to pad all bony prominences. After a preincinduction time out anesthesia was induced. The right lower extremity was prepped and draped in normal sterile fashion and a pre-incision timeout was performed. He received ancef for preoperative antibiotics.   I performed a preoperative exam which demonstrated significant sagging of his tibia severe instability to dial as  well.  He had a palpable defect is patella tendon insertion as well.  I made a lateral incision of roughly 12 cm and dissected down to his fibular head. Identified his peroneal nerve I performed a neuroplasty year in order to mobilize his nerve and keep it out of the way of his fibular tunnel.  His peroneal nerve was protected throughout the remainder the case. Any was without harm within the case.  Next I identified his fibular head and 5 placement for the drill tunnel used the drill guide to drill a tunnel through the fibular head of 5 elevators. Both allografts were thawed prepped and an whipstitched at the ends.  Next identified the posterior tibia I used subperiosteal elevation over the posterior aspect of the tibia into the sulcus I then used the tibial guide to drill from the flat spot between Gerdy's tibial tubercle and the tibial tubercle to the previously mentioned spot at the back with an 8 mm tunnel for passage of both grafts. Next I split his IT band and identified the insertion site of his LCL. There was enough LCL tissue as able to identify this insertion I then traveled 18 mm distal and anterior to the insertion site of the popliteus I placed pins of both these sites and then drilled dead-end tunnels here and placed a passing stitch. I then placed the initial graft to the fibula and into the LCL tunnel underneath the IT band. I placed a second graft into the femoral tunnel as well routed it below deep to the first graft. I then took one graft from the back of the fibular tunnel and the other graft from the popliteal tract and past both of them into the back of the tibia and out the  front. I placed 55 swivel lock screws into the femoral tunnel insertion I had the back this up with a second screw on both as his bone was slightly soft. I tensioned these and then placed a screw through the fibula followed by one at the tibial tunnel tightening both grafts I tested it was stable.   Next a made  an anterior incision over his patella tendon and identified his tendon which was all but completely ruptured. I identified the bare spot on his tibial tubercle and I placed a 475 swivel lock anchor here. It had an excellent bite and was loaded with 2 stitches I whipstitched up and back with both stitches and then tied these into the swivel lock. This had excellent purchase all 4 strands were whipstitched into the patella tendon. I then oversewed the capsule at this portion with a 2-0 FiberWire   next I inserted the arthroscope on either side of the tendons identified his patella tendon was identified his patella fracture who was small but slipped off anteriorly.  He had meniscal tears of both medial and lateral sides that I debrided. I debrided his anterior cruciate ligament stump so as not to cut in the joint.  Next I thoroughly irrigated all incisions I tested his graft again and it was stable to varus and valgus stressing. Patella tendon had no gapping at 30. I placed him in full extension I irrigated his incisions closed his IT band and then closed the skin at both incision points. Placed him in a long-leg splint he was awoken and taken the PACU in stable condition.    POST OPERATIVE PLAN: I will nonweightbearing aspirin for DVT prophylaxis long-leg splint full-time    This note was generated using a template and dragon dictation system. In light of that, I have reviewed the note and all aspects of it are applicable to this case. Any dictation errors are due to the computerized dictation system.

## 2015-08-17 NOTE — Interval H&P Note (Signed)
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 After consideration of risks, benefits and other options for treatment, the patient has consented to  Procedure(s): RIGHT KNEE ARTHROSCOPY, LATERAL MENISECTOMY,  POSTEROLATERAL CORNER RECONSTRUCTION WITH LATERAL COLLATERAL LIGAMENT RECONSTRUCTION USING ANTERIOR TIBILIAS GRAFT (Right) RIGHT PATELLA TENDON REPAIR (Right) as a surgical intervention .  The patient's history has been reviewed, patient examined, no change in status, stable for surgery.  I have reviewed the patient's chart and labs.  Questions were answered to the patient's satisfaction.     Wesley Prince

## 2015-08-17 NOTE — Anesthesia Preprocedure Evaluation (Addendum)
Anesthesia Evaluation  Patient identified by MRN, date of birth, ID band Patient awake    Reviewed: Allergy & Precautions, NPO status , Patient's Chart, lab work & pertinent test results  Airway Mallampati: I   Neck ROM: Limited   Comment: C Collar in place for C6 fracture Dental  (+) Teeth Intact   Pulmonary Current Smoker,    breath sounds clear to auscultation       Cardiovascular negative cardio ROS   Rhythm:Regular Rate:Normal     Neuro/Psych negative neurological ROS  negative psych ROS   GI/Hepatic Neg liver ROS, PUD,   Endo/Other  negative endocrine ROS  Renal/GU Renal InsufficiencyRenal disease  negative genitourinary   Musculoskeletal negative musculoskeletal ROS (+)   Abdominal   Peds negative pediatric ROS (+)  Hematology negative hematology ROS (+)   Anesthesia Other Findings Pt denies neurologic symptoms related to his C^ fracture. He takes his "collar off to eat and when he is sitting around the house".  Reproductive/Obstetrics negative OB ROS                           Lab Results  Component Value Date   WBC 6.2 07/28/2015   HGB 10.4* 07/28/2015   HCT 31.5* 07/28/2015   MCV 92.9 07/28/2015   PLT 145* 07/28/2015   Lab Results  Component Value Date   CREATININE 0.93 07/28/2015   BUN 5* 07/28/2015   NA 137 07/28/2015   K 4.1 07/28/2015   CL 105 07/28/2015   CO2 25 07/28/2015   Lab Results  Component Value Date   INR 1.03 07/27/2015   07/2015 EKG: normal sinus rhythm.    Anesthesia Physical Anesthesia Plan  ASA: II  Anesthesia Plan: General   Post-op Pain Management: GA combined w/ Regional for post-op pain   Induction: Intravenous  Airway Management Planned: LMA  Additional Equipment:   Intra-op Plan:   Post-operative Plan: Extubation in OR  Informed Consent: I have reviewed the patients History and Physical, chart, labs and discussed the  procedure including the risks, benefits and alternatives for the proposed anesthesia with the patient or authorized representative who has indicated his/her understanding and acceptance.   Dental advisory given  Plan Discussed with: CRNA  Anesthesia Plan Comments:         Anesthesia Quick Evaluation

## 2015-08-17 NOTE — Progress Notes (Signed)
Assisted Dr. Smith Robert with right, ultrasound guided, femoral nerve block. Side rails up, monitors on throughout procedure. See vital signs in flow sheet. Tolerated Procedure well.

## 2015-08-17 NOTE — Interval H&P Note (Signed)
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 After consideration of risks, benefits and other options for treatment, the patient has consented to  Procedure(s): RIGHT KNEE ARTHROSCOPY MEDIAL/LATERAL MENISECTOMIES, ANTERIOR CRUCIATE LIGAMENT RECONSTRUCTION, POSTERIOR CRUCIATE LIAGAMENT RECONSTRUCTION, POSTEROLATERAL CORNER RECONSTRUCTION (Right) RIGHT PATELLA TENDON REPAIR (Right) as a surgical intervention .  The patient's history has been reviewed, patient examined, no change in status, stable for surgery.  I have reviewed the patient's chart and labs.  Questions were answered to the patient's satisfaction.     Aston Lawhorn D

## 2015-08-17 NOTE — Anesthesia Postprocedure Evaluation (Signed)
Anesthesia Post Note  Patient: Wesley Prince  Procedure(s) Performed: Procedure(s) (LRB): RIGHT KNEE ARTHROSCOPY, LATERAL MENISECTOMY,  POSTEROLATERAL CORNER RECONSTRUCTION WITH LATERAL COLLATERAL LIGAMENT RECONSTRUCTION USING ANTERIOR TIBILIAS GRAFT (Right) RIGHT PATELLA TENDON REPAIR (Right) KNEE ARTHROSCOPY WITH LATERAL MENISECTOMY (Right) Peroneal Nerve Decompression (Right)  Patient location during evaluation: PACU Anesthesia Type: General and Regional Level of consciousness: awake and alert Pain management: pain level controlled Vital Signs Assessment: post-procedure vital signs reviewed and stable Respiratory status: spontaneous breathing Cardiovascular status: blood pressure returned to baseline Anesthetic complications: no    Last Vitals:  Filed Vitals:   08/17/15 1500 08/17/15 1530  BP: 118/88 137/80  Pulse: 88 89  Temp:  36.7 C  Resp: 21 18    Last Pain:  Filed Vitals:   08/17/15 1547  PainSc: 2                  Tiajuana Amass

## 2015-08-17 NOTE — Anesthesia Procedure Notes (Addendum)
Anesthesia Regional Block:  Femoral nerve block  Pre-Anesthetic Checklist: ,, timeout performed, Correct Patient, Correct Site, Correct Laterality, Correct Procedure, Correct Position, site marked, Risks and benefits discussed,  Surgical consent,  Pre-op evaluation,  At surgeon's request and post-op pain management  Laterality: Right  Prep: chloraprep       Needles:  Injection technique: Single-shot  Needle Type: Echogenic Needle     Needle Length: 9cm 9 cm Needle Gauge: 21 and 21 G    Additional Needles:  Procedures: ultrasound guided (picture in chart) Femoral nerve block Narrative:  Start time: 08/17/2015 9:10 AM End time: 08/17/2015 9:13 AM Injection made incrementally with aspirations every 5 mL.  Performed by: Personally  Anesthesiologist: Suella Broad D  Additional Notes: No immediate complications noted.    Procedure Name: LMA Insertion Performed by: Tawni Millers Pre-anesthesia Checklist: Patient identified, Emergency Drugs available, Suction available and Patient being monitored Patient Re-evaluated:Patient Re-evaluated prior to inductionOxygen Delivery Method: Circle System Utilized Preoxygenation: Pre-oxygenation with 100% oxygen Intubation Type: IV induction Ventilation: Mask ventilation without difficulty LMA: LMA inserted LMA Size: 5.0 Number of attempts: 1 Airway Equipment and Method: Bite block Placement Confirmation: positive ETCO2 Tube secured with: Tape Dental Injury: Teeth and Oropharynx as per pre-operative assessment

## 2015-08-18 ENCOUNTER — Encounter (HOSPITAL_BASED_OUTPATIENT_CLINIC_OR_DEPARTMENT_OTHER): Payer: Self-pay | Admitting: Orthopedic Surgery

## 2015-08-18 DIAGNOSIS — M23611 Other spontaneous disruption of anterior cruciate ligament of right knee: Secondary | ICD-10-CM | POA: Diagnosis not present

## 2015-08-24 ENCOUNTER — Encounter (HOSPITAL_BASED_OUTPATIENT_CLINIC_OR_DEPARTMENT_OTHER): Payer: Self-pay | Admitting: *Deleted

## 2015-08-25 ENCOUNTER — Ambulatory Visit (HOSPITAL_COMMUNITY): Payer: Medicaid Other

## 2015-08-25 ENCOUNTER — Encounter (HOSPITAL_BASED_OUTPATIENT_CLINIC_OR_DEPARTMENT_OTHER): Payer: Self-pay | Admitting: Anesthesiology

## 2015-08-25 ENCOUNTER — Ambulatory Visit (HOSPITAL_BASED_OUTPATIENT_CLINIC_OR_DEPARTMENT_OTHER)
Admission: RE | Admit: 2015-08-25 | Discharge: 2015-08-25 | Disposition: A | Discharge status: Home / Self Care | Payer: Medicaid Other | Source: Ambulatory Visit | Attending: Orthopedic Surgery | Admitting: Orthopedic Surgery

## 2015-08-25 ENCOUNTER — Encounter (HOSPITAL_BASED_OUTPATIENT_CLINIC_OR_DEPARTMENT_OTHER)
Admission: RE | Disposition: A | Discharge status: Home / Self Care | Payer: Self-pay | Source: Ambulatory Visit | Attending: Orthopedic Surgery

## 2015-08-25 ENCOUNTER — Ambulatory Visit (HOSPITAL_BASED_OUTPATIENT_CLINIC_OR_DEPARTMENT_OTHER): Payer: Medicaid Other | Admitting: Anesthesiology

## 2015-08-25 DIAGNOSIS — Z79899 Other long term (current) drug therapy: Secondary | ICD-10-CM | POA: Insufficient documentation

## 2015-08-25 DIAGNOSIS — S83101A Unspecified subluxation of right knee, initial encounter: Secondary | ICD-10-CM | POA: Insufficient documentation

## 2015-08-25 DIAGNOSIS — F17200 Nicotine dependence, unspecified, uncomplicated: Secondary | ICD-10-CM | POA: Diagnosis not present

## 2015-08-25 DIAGNOSIS — Z419 Encounter for procedure for purposes other than remedying health state, unspecified: Secondary | ICD-10-CM

## 2015-08-25 HISTORY — PX: EXTERNAL FIXATION LEG: SHX1549

## 2015-08-25 SURGERY — EXTERNAL FIXATION, LOWER EXTREMITY
Anesthesia: General | Laterality: Right

## 2015-08-25 MED ORDER — FENTANYL CITRATE (PF) 100 MCG/2ML IJ SOLN
INTRAMUSCULAR | Status: AC
  Filled 2015-08-25: qty 2

## 2015-08-25 MED ORDER — LACTATED RINGERS IV SOLN
INTRAVENOUS | Status: DC
  Administered 2015-08-25 (×2): via INTRAVENOUS

## 2015-08-25 MED ORDER — CEFAZOLIN SODIUM-DEXTROSE 2-3 GM-% IV SOLR
2.0000 g | INTRAVENOUS | Status: AC
  Administered 2015-08-25: 2 g via INTRAVENOUS

## 2015-08-25 MED ORDER — OXYCODONE HCL 5 MG PO TABS
ORAL_TABLET | ORAL | Status: AC
  Filled 2015-08-25: qty 2

## 2015-08-25 MED ORDER — ONDANSETRON HCL 4 MG PO TABS: 4.0000 mg | ORAL_TABLET | Freq: Three times a day (TID) | ORAL | Status: DC | PRN

## 2015-08-25 MED ORDER — ONDANSETRON HCL 4 MG/2ML IJ SOLN
INTRAMUSCULAR | Status: DC | PRN
  Administered 2015-08-25: 4 mg via INTRAVENOUS

## 2015-08-25 MED ORDER — HYDROMORPHONE HCL 1 MG/ML IJ SOLN
0.5000 mg | INTRAMUSCULAR | Status: DC | PRN
  Administered 2015-08-25 (×2): 0.5 mg via INTRAVENOUS

## 2015-08-25 MED ORDER — FENTANYL CITRATE (PF) 100 MCG/2ML IJ SOLN
INTRAMUSCULAR | Status: DC | PRN
  Administered 2015-08-25: 25 ug via INTRAVENOUS
  Administered 2015-08-25: 100 ug via INTRAVENOUS
  Administered 2015-08-25 (×3): 25 ug via INTRAVENOUS
  Administered 2015-08-25 (×2): 50 ug via INTRAVENOUS

## 2015-08-25 MED ORDER — DIAZEPAM 5 MG PO TABS: 5.0000 mg | ORAL_TABLET | Freq: Four times a day (QID) | ORAL | Status: DC | PRN

## 2015-08-25 MED ORDER — PROPOFOL 10 MG/ML IV BOLUS
INTRAVENOUS | Status: DC | PRN
  Administered 2015-08-25: 50 mg via INTRAVENOUS
  Administered 2015-08-25: 200 mg via INTRAVENOUS

## 2015-08-25 MED ORDER — HYDROMORPHONE HCL 1 MG/ML IJ SOLN
0.2500 mg | INTRAMUSCULAR | Status: DC | PRN
  Administered 2015-08-25 (×4): 0.5 mg via INTRAVENOUS

## 2015-08-25 MED ORDER — FENTANYL CITRATE (PF) 100 MCG/2ML IJ SOLN: 50.0000 ug | INTRAMUSCULAR | Status: DC | PRN

## 2015-08-25 MED ORDER — DEXAMETHASONE SODIUM PHOSPHATE 10 MG/ML IJ SOLN
INTRAMUSCULAR | Status: AC
  Filled 2015-08-25: qty 1

## 2015-08-25 MED ORDER — MIDAZOLAM HCL 5 MG/5ML IJ SOLN
INTRAMUSCULAR | Status: DC | PRN
  Administered 2015-08-25: 2 mg via INTRAVENOUS

## 2015-08-25 MED ORDER — CHLORHEXIDINE GLUCONATE 4 % EX LIQD: 60.0000 mL | Freq: Once | CUTANEOUS | Status: DC

## 2015-08-25 MED ORDER — MIDAZOLAM HCL 2 MG/2ML IJ SOLN
INTRAMUSCULAR | Status: AC
  Filled 2015-08-25: qty 2

## 2015-08-25 MED ORDER — POTASSIUM CHLORIDE IN NACL 20-0.45 MEQ/L-% IV SOLN: INTRAVENOUS | Status: DC

## 2015-08-25 MED ORDER — KETOROLAC TROMETHAMINE 30 MG/ML IJ SOLN
INTRAMUSCULAR | Status: DC | PRN
  Administered 2015-08-25: 30 mg via INTRAVENOUS

## 2015-08-25 MED ORDER — OXYCODONE-ACETAMINOPHEN 5-325 MG PO TABS: 1.0000 | ORAL_TABLET | ORAL | Status: DC | PRN

## 2015-08-25 MED ORDER — GLYCOPYRROLATE 0.2 MG/ML IJ SOLN: 0.2000 mg | Freq: Once | INTRAMUSCULAR | Status: DC | PRN

## 2015-08-25 MED ORDER — OXYCODONE HCL 5 MG PO TABS
10.0000 mg | ORAL_TABLET | Freq: Once | ORAL | Status: AC | PRN
  Administered 2015-08-25: 10 mg via ORAL

## 2015-08-25 MED ORDER — SCOPOLAMINE 1 MG/3DAYS TD PT72: 1.0000 | MEDICATED_PATCH | Freq: Once | TRANSDERMAL | Status: DC | PRN

## 2015-08-25 MED ORDER — HYDROMORPHONE HCL 1 MG/ML IJ SOLN
INTRAMUSCULAR | Status: AC
  Filled 2015-08-25: qty 1

## 2015-08-25 MED ORDER — PROMETHAZINE HCL 25 MG/ML IJ SOLN: 6.2500 mg | INTRAMUSCULAR | Status: DC | PRN

## 2015-08-25 MED ORDER — PROPOFOL 10 MG/ML IV BOLUS
INTRAVENOUS | Status: AC
  Filled 2015-08-25: qty 40

## 2015-08-25 MED ORDER — KETOROLAC TROMETHAMINE 30 MG/ML IJ SOLN
INTRAMUSCULAR | Status: AC
  Filled 2015-08-25: qty 1

## 2015-08-25 MED ORDER — LIDOCAINE HCL (CARDIAC) 20 MG/ML IV SOLN
INTRAVENOUS | Status: AC
  Filled 2015-08-25: qty 5

## 2015-08-25 MED ORDER — DEXAMETHASONE SODIUM PHOSPHATE 4 MG/ML IJ SOLN
INTRAMUSCULAR | Status: DC | PRN
  Administered 2015-08-25: 10 mg via INTRAVENOUS

## 2015-08-25 MED ORDER — MIDAZOLAM HCL 2 MG/2ML IJ SOLN: 1.0000 mg | INTRAMUSCULAR | Status: DC | PRN

## 2015-08-25 MED ORDER — LIDOCAINE HCL (CARDIAC) 20 MG/ML IV SOLN
INTRAVENOUS | Status: DC | PRN
  Administered 2015-08-25: 50 mg via INTRAVENOUS

## 2015-08-25 MED ORDER — ONDANSETRON HCL 4 MG/2ML IJ SOLN
INTRAMUSCULAR | Status: AC
  Filled 2015-08-25: qty 2

## 2015-08-25 MED ORDER — DOCUSATE SODIUM 100 MG PO CAPS: 100.0000 mg | ORAL_CAPSULE | Freq: Two times a day (BID) | ORAL | Status: DC

## 2015-08-25 MED ORDER — ACETAMINOPHEN 500 MG PO TABS
ORAL_TABLET | ORAL | Status: AC
  Filled 2015-08-25: qty 2

## 2015-08-25 MED ORDER — ACETAMINOPHEN 500 MG PO TABS
1000.0000 mg | ORAL_TABLET | Freq: Once | ORAL | Status: AC
  Administered 2015-08-25: 1000 mg via ORAL

## 2015-08-25 SURGICAL SUPPLY — 68 items
11X350MM ROD ×1 IMPLANT
30 DEGREE POST ×2 IMPLANT
5 HOLE PIN CLAMP ×3 IMPLANT
BAG DECANTER FOR FLEXI CONT (MISCELLANEOUS) IMPLANT
BANDAGE ACE 4X5 VEL STRL LF (GAUZE/BANDAGES/DRESSINGS) ×2 IMPLANT
BANDAGE ACE 6X5 VEL STRL LF (GAUZE/BANDAGES/DRESSINGS) ×2 IMPLANT
BANDAGE ESMARK 6X9 LF (GAUZE/BANDAGES/DRESSINGS) IMPLANT
BLADE SURG 15 STRL LF DISP TIS (BLADE) ×1 IMPLANT
BLADE SURG 15 STRL SS (BLADE)
BNDG CMPR 9X6 STRL LF SNTH (GAUZE/BANDAGES/DRESSINGS)
BNDG COHESIVE 4X5 TAN STRL (GAUZE/BANDAGES/DRESSINGS) ×3 IMPLANT
BNDG ESMARK 6X9 LF (GAUZE/BANDAGES/DRESSINGS)
BNDG GAUZE ELAST 4 BULKY (GAUZE/BANDAGES/DRESSINGS) ×4 IMPLANT
CHLORAPREP W/TINT 26ML (MISCELLANEOUS) ×3 IMPLANT
CLOSURE STERI-STRIP 1/2X4 (GAUZE/BANDAGES/DRESSINGS)
CLSR STERI-STRIP ANTIMIC 1/2X4 (GAUZE/BANDAGES/DRESSINGS) IMPLANT
COUPLING, PIN TO BAR ×4 IMPLANT
COVER BACK TABLE 60X90IN (DRAPES) IMPLANT
COVER MAYO STAND STRL (DRAPES) IMPLANT
CUFF TOURNIQUET SINGLE 34IN LL (TOURNIQUET CUFF) IMPLANT
DRAPE C-ARM 42X72 X-RAY (DRAPES) ×3 IMPLANT
DRAPE C-ARMOR (DRAPES) ×3 IMPLANT
DRAPE EXTREMITY T 121X128X90 (DRAPE) IMPLANT
DRAPE ORTHO SPLIT 77X108 STRL (DRAPES)
DRAPE SURG ORHT 6 SPLT 77X108 (DRAPES) IMPLANT
DRAPE U 20/CS (DRAPES) ×3 IMPLANT
DRAPE U-SHAPE 47X51 STRL (DRAPES) ×3 IMPLANT
DRSG ADAPTIC 3X8 NADH LF (GAUZE/BANDAGES/DRESSINGS) ×2 IMPLANT
DRSG PAD ABDOMINAL 8X10 ST (GAUZE/BANDAGES/DRESSINGS) ×3 IMPLANT
ELECT REM PT RETURN 9FT ADLT (ELECTROSURGICAL) ×3
ELECTRODE REM PT RTRN 9FT ADLT (ELECTROSURGICAL) IMPLANT
GAUZE SPONGE 4X4 12PLY STRL (GAUZE/BANDAGES/DRESSINGS) ×3 IMPLANT
GAUZE XEROFORM 1X8 LF (GAUZE/BANDAGES/DRESSINGS) IMPLANT
GAUZE XEROFORM 5X9 LF (GAUZE/BANDAGES/DRESSINGS) ×2 IMPLANT
GLOVE BIO SURGEON STRL SZ7 (GLOVE) ×3 IMPLANT
GLOVE BIO SURGEON STRL SZ7.5 (GLOVE) ×3 IMPLANT
GLOVE BIO SURGEON STRL SZ8 (GLOVE) ×3 IMPLANT
GLOVE BIOGEL PI IND STRL 7.0 (GLOVE) ×1 IMPLANT
GLOVE BIOGEL PI IND STRL 8 (GLOVE) ×2 IMPLANT
GLOVE BIOGEL PI INDICATOR 7.0 (GLOVE) ×2
GLOVE BIOGEL PI INDICATOR 8 (GLOVE) ×6
GLOVE ECLIPSE 6.5 STRL STRAW (GLOVE) ×3 IMPLANT
GOWN STRL REUS W/ TWL LRG LVL3 (GOWN DISPOSABLE) ×2 IMPLANT
GOWN STRL REUS W/ TWL XL LVL3 (GOWN DISPOSABLE) ×1 IMPLANT
GOWN STRL REUS W/TWL LRG LVL3 (GOWN DISPOSABLE) ×6
GOWN STRL REUS W/TWL XL LVL3 (GOWN DISPOSABLE) ×6 IMPLANT
NDL HYPO 25X1 1.5 SAFETY (NEEDLE) IMPLANT
NEEDLE HYPO 25X1 1.5 SAFETY (NEEDLE) IMPLANT
NS IRRIG 1000ML POUR BTL (IV SOLUTION) ×3 IMPLANT
PACK BASIN DAY SURGERY FS (CUSTOM PROCEDURE TRAY) ×3 IMPLANT
PAD CAST 4YDX4 CTTN HI CHSV (CAST SUPPLIES) IMPLANT
PADDING CAST COTTON 4X4 STRL (CAST SUPPLIES)
PENCIL BUTTON HOLSTER BLD 10FT (ELECTRODE) IMPLANT
ROD 11MMX400MM ×2 IMPLANT
ROD TO ROD COUPLING ×8 IMPLANT
SLEEVE SCD COMPRESS KNEE MED (MISCELLANEOUS) IMPLANT
SPONGE LAP 4X18 X RAY DECT (DISPOSABLE) IMPLANT
STOCKINETTE 6  STRL (DRAPES)
STOCKINETTE 6 STRL (DRAPES) IMPLANT
STOCKINETTE IMPERVIOUS LG (DRAPES) ×2 IMPLANT
SUT ETHILON 3 0 PS 1 (SUTURE) IMPLANT
SUT MON AB 2-0 CT1 36 (SUTURE) IMPLANT
SUT VIC AB 2-0 SH 27 (SUTURE)
SUT VIC AB 2-0 SH 27XBRD (SUTURE) IMPLANT
SYR BULB 3OZ (MISCELLANEOUS) IMPLANT
SYR CONTROL 10ML LL (SYRINGE) IMPLANT
UNDERPAD 30X30 (UNDERPADS AND DIAPERS) ×3 IMPLANT
apex pin ×8 IMPLANT

## 2015-08-25 NOTE — Transfer of Care (Signed)
Immediate Anesthesia Transfer of Care Note  Patient: Wesley Prince  Procedure(s) Performed: Procedure(s): EXTERNAL FIXATION RIGHT KNEE (Right)  Patient Location: PACU  Anesthesia Type:General  Level of Consciousness: awake and patient cooperative  Airway & Oxygen Therapy: Patient Spontanous Breathing and Patient connected to face mask oxygen  Post-op Assessment: Report given to RN and Post -op Vital signs reviewed and stable  Post vital signs: Reviewed and stable  Last Vitals: There were no vitals filed for this visit.  Complications: No apparent anesthesia complications

## 2015-08-25 NOTE — Anesthesia Procedure Notes (Signed)
Procedure Name: LMA Insertion Date/Time: 08/25/2015 2:36 PM Performed by: Toula Moos L Pre-anesthesia Checklist: Patient identified, Emergency Drugs available, Suction available, Patient being monitored and Timeout performed Patient Re-evaluated:Patient Re-evaluated prior to inductionOxygen Delivery Method: Circle System Utilized Preoxygenation: Pre-oxygenation with 100% oxygen Intubation Type: IV induction Ventilation: Mask ventilation without difficulty LMA: LMA inserted LMA Size: 5.0 Number of attempts: 1 Airway Equipment and Method: Bite block Placement Confirmation: positive ETCO2 Tube secured with: Tape Dental Injury: Teeth and Oropharynx as per pre-operative assessment

## 2015-08-25 NOTE — H&P (View-Only) (Signed)
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 Symptoms are rated as moderate to severe, and have been worsening.  This is significantly impairing activities of daily living.  He has elected for surgical management.   Past Medical History  Diagnosis Date  . Kidney stones   . Gastric ulcer   . Plantar fasciitis    Past Surgical History  Procedure Laterality Date  . Mandible fracture surgery    . Cyst removed from coccyx     Social History   Social History  . Marital Status: Single    Spouse Name: N/A  . Number of Children: N/A  . Years of Education: N/A   Social History Main Topics  . Smoking status: Never Smoker   . Smokeless tobacco: Not on file  . Alcohol Use: No  . Drug Use: Yes    Special: Marijuana, Cocaine  . Sexual Activity: Not on file   Other Topics Concern  . Not on file   Social History Narrative   ** Merged History Encounter **       Family History  Problem Relation Age of Onset  . CAD Mother   . Hypertension Mother    No Known Allergies Prior to Admission medications   Medication Sig Start Date End Date Taking? Authorizing Provider  cephALEXin (KEFLEX) 500 MG capsule Take 1 capsule (500 mg total) by mouth 4 (four) times daily. 02/08/15   Tatyana Kirichenko, PA-C  HYDROcodone-acetaminophen (NORCO) 5-325 MG per tablet Take 1-2 tablets by mouth every 4 (four) hours as needed for pain. Patient not taking: Reported on 02/08/2015 05/22/12   Margarita Mail, PA-C  meloxicam (MOBIC) 15 MG tablet Take 1 tablet (15 mg total) by mouth daily. With food. Patient not taking: Reported on 02/08/2015 05/22/12   Margarita Mail, PA-C  oxyCODONE-acetaminophen (PERCOCET) 10-325 MG tablet Take 1-2 tablets by mouth every 4  (four) hours as needed for pain. 08/01/15   Lisette Abu, PA-C  Sildenafil Citrate (VIAGRA PO) Take by mouth once.    Historical Provider, MD  traMADol (ULTRAM) 50 MG tablet Take 1 tablet (50 mg total) by mouth every 6 (six) hours as needed. 02/08/15   Tatyana Kirichenko, PA-C  traMADol (ULTRAM) 50 MG tablet Take 2 tablets (100 mg total) by mouth every 6 (six) hours. 08/01/15   Lisette Abu, PA-C     Positive ROS: All other systems have been reviewed and were otherwise negative with the exception of those mentioned in the HPI and as above.  Physical Exam: General: Alert, no acute distress Cardiovascular: No pedal edema Respiratory: No cyanosis, no use of accessory musculature GI: No organomegaly, abdomen is soft and non-tender Skin: No lesions in the area of chief complaint Neurologic: Sensation intact distally Psychiatric: Patient is competent for consent with normal mood and affect Lymphatic: No axillary or cervical lymphadenopathy  MUSCULOSKELETAL:  R knee has moderate swelling and ecchymosis.  ROM limited due to pain. Laxity with lauchman. Sensation intact with 2+ distal pulses.   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   Gae Dry, PA-C  08/14/2015 8:59 AM

## 2015-08-25 NOTE — Discharge Instructions (Signed)
Non-weight bearing in the leg  Dressings must stay clean and dry till follow up in the office  Pain medication and muscle relaxer must last until follow up.  Elevate the leg to help control swelling.   Post Anesthesia Home Care Instructions  Activity: Get plenty of rest for the remainder of the day. A responsible adult should stay with you for 24 hours following the procedure.  For the next 24 hours, DO NOT: -Drive a car -Paediatric nurse -Drink alcoholic beverages -Take any medication unless instructed by your physician -Make any legal decisions or sign important papers.  Meals: Start with liquid foods such as gelatin or soup. Progress to regular foods as tolerated. Avoid greasy, spicy, heavy foods. If nausea and/or vomiting occur, drink only clear liquids until the nausea and/or vomiting subsides. Call your physician if vomiting continues.  Special Instructions/Symptoms: Your throat may feel dry or sore from the anesthesia or the breathing tube placed in your throat during surgery. If this causes discomfort, gargle with warm salt water. The discomfort should disappear within 24 hours.  If you had a scopolamine patch placed behind your ear for the management of post- operative nausea and/or vomiting:  1. The medication in the patch is effective for 72 hours, after which it should be removed.  Wrap patch in a tissue and discard in the trash. Wash hands thoroughly with soap and water. 2. You may remove the patch earlier than 72 hours if you experience unpleasant side effects which may include dry mouth, dizziness or visual disturbances. 3. Avoid touching the patch. Wash your hands with soap and water after contact with the patch.

## 2015-08-25 NOTE — Anesthesia Preprocedure Evaluation (Addendum)
Anesthesia Evaluation  Patient identified by MRN, date of birth, ID band Patient awake    Reviewed: Allergy & Precautions, H&P , NPO status , Patient's Chart, lab work & pertinent test results  Airway Mallampati: I  TM Distance: >3 FB Neck ROM: Full    Dental no notable dental hx. (+) Teeth Intact, Dental Advisory Given   Pulmonary Current Smoker,    Pulmonary exam normal breath sounds clear to auscultation       Cardiovascular negative cardio ROS   Rhythm:Regular Rate:Normal     Neuro/Psych negative neurological ROS  negative psych ROS   GI/Hepatic Neg liver ROS, PUD,   Endo/Other  negative endocrine ROS  Renal/GU negative Renal ROS  negative genitourinary   Musculoskeletal   Abdominal   Peds  Hematology negative hematology ROS (+)   Anesthesia Other Findings C6 fracture from trauma 1 month ago.  No sx.  In collar.  Reproductive/Obstetrics negative OB ROS                            Anesthesia Physical Anesthesia Plan  ASA: II  Anesthesia Plan: General   Post-op Pain Management:    Induction: Intravenous  Airway Management Planned: LMA  Additional Equipment:   Intra-op Plan:   Post-operative Plan: Extubation in OR  Informed Consent: I have reviewed the patients History and Physical, chart, labs and discussed the procedure including the risks, benefits and alternatives for the proposed anesthesia with the patient or authorized representative who has indicated his/her understanding and acceptance.   Dental advisory given  Plan Discussed with: CRNA, Anesthesiologist and Surgeon  Anesthesia Plan Comments:        Anesthesia Quick Evaluation

## 2015-08-25 NOTE — Op Note (Signed)
08/25/2015  3:41 PM  PATIENT:  Wesley Prince    PRE-OPERATIVE DIAGNOSIS:  SUBLUXATION RIGHT KNEE  POST-OPERATIVE DIAGNOSIS:  Same  PROCEDURE:  EXTERNAL FIXATION RIGHT KNEE  SURGEON:  Ramesh Moan, Ernesta Amble, MD  ASSISTANT: Lovett Calender, PA-C, She was present and scrubbed throughout the case, critical for completion in a timely fashion, and for retraction, instrumentation, and closure.   ANESTHESIA:   gen  PREOPERATIVE INDICATIONS:  [name] is a  55 y.o. male with a diagnosis of SUBLUXATION RIGHT KNEE who failed conservative measures and elected for surgical management.    The risks benefits and alternatives were discussed with the patient preoperatively including but not limited to the risks of infection, bleeding, nerve injury, cardiopulmonary complications, the need for revision surgery, among others, and the patient was willing to proceed.  OPERATIVE IMPLANTS: hoffman ex fix  OPERATIVE FINDINGS: persistent posterior tibial sag  BLOOD LOSS: min  COMPLICATIONS: none  TOURNIQUET TIME: none  OPERATIVE PROCEDURE:  Patient was identified in the preoperative holding area and site was marked by me He was transported to the operating theater and placed on the table in supine position taking care to pad all bony prominences. After a preincinduction time out anesthesia was induced. The right lower extremity was prepped and draped in normal sterile fashion and a pre-incision timeout was performed. He received ancef for preoperative antibiotics.    I took multiple x-rays of his leg and there was still some persistent posterior sag of his tibia. We had previously attempted to control this with casting but were unsuccessful.   I then placed 2 external fixator 5 mm pins into his femur from anterior to posterior engaging both cortices.  I then placed 2 pins in his tibia engaging both cortices.    I then assembled a knee spanning frame I performed a reduction of his tibia did require some  traction and anterior pressure on this tibia. I then locked the frame and place after a few adjustments I took multiple x-rays and was happy with the alignment of the knee in this external fixator frame I placed a third bar across the construct to further reinforce this. I final tightened all connections.   As happy with all final x-rays.  I placed sterile dressings he was awoken and taken the PACU in stable condition.  POST OPERATIVE PLAN: NWB, ASA for dvt px.     This note was generated using a template and dragon dictation system. In light of that, I have reviewed the note and all aspects of it are applicable to this case. Any dictation errors are due to the computerized dictation system.

## 2015-08-25 NOTE — Anesthesia Postprocedure Evaluation (Signed)
Anesthesia Post Note  Patient: Wesley Prince  Procedure(s) Performed: Procedure(s) (LRB): EXTERNAL FIXATION RIGHT KNEE (Right)  Patient location during evaluation: PACU Anesthesia Type: General Level of consciousness: awake and alert Pain management: pain level controlled Vital Signs Assessment: post-procedure vital signs reviewed and stable Respiratory status: spontaneous breathing, nonlabored ventilation and respiratory function stable Cardiovascular status: blood pressure returned to baseline and stable Postop Assessment: no signs of nausea or vomiting Anesthetic complications: no    Last Vitals:  Filed Vitals:   08/25/15 1645 08/25/15 1709  BP: 142/96 143/87  Pulse: 85 85  Temp:  36.6 C  Resp: 14 20    Last Pain:  Filed Vitals:   08/25/15 1712  PainSc: 6                  Yer Olivencia A

## 2015-08-25 NOTE — Interval H&P Note (Signed)
History and Physical Interval Note:  08/25/2015 8:10 AM  Wesley Prince  has presented today for surgery, with the diagnosis of SUBLUXATION RIGHT KNEE  The various methods of treatment have been discussed with the patient and family. After consideration of risks, benefits and other options for treatment, the patient has consented to  Procedure(s): EXTERNAL FIXATION RIGHT KNEE (Right) as a surgical intervention .  The patient's history has been reviewed, patient examined, no change in status, stable for surgery.  I have reviewed the patient's chart and labs.  Questions were answered to the patient's satisfaction.     Yanique Mulvihill D

## 2015-08-28 ENCOUNTER — Encounter (HOSPITAL_BASED_OUTPATIENT_CLINIC_OR_DEPARTMENT_OTHER): Payer: Self-pay | Admitting: Orthopedic Surgery

## 2015-08-28 NOTE — Addendum Note (Signed)
Addendum  created 08/28/15 1057 by Marrianne Mood, CRNA   Modules edited: Charges VN

## 2015-08-28 NOTE — Addendum Note (Signed)
Addendum  created 08/28/15 1117 by Marrianne Mood, CRNA   Modules edited: Charges VN

## 2015-09-12 ENCOUNTER — Other Ambulatory Visit: Payer: Self-pay | Admitting: Neurosurgery

## 2015-09-12 DIAGNOSIS — S12500D Unspecified displaced fracture of sixth cervical vertebra, subsequent encounter for fracture with routine healing: Secondary | ICD-10-CM
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
## 2015-09-17 ENCOUNTER — Encounter (HOSPITAL_COMMUNITY): Payer: Self-pay

## 2015-09-17 ENCOUNTER — Emergency Department (HOSPITAL_COMMUNITY)
Admission: EM | Admit: 2015-09-17 | Discharge: 2015-09-17 | Disposition: A | Discharge status: Home / Self Care | Payer: Medicaid Other | Attending: Emergency Medicine | Admitting: Emergency Medicine

## 2015-09-17 ENCOUNTER — Emergency Department (HOSPITAL_COMMUNITY): Payer: Medicaid Other

## 2015-09-17 DIAGNOSIS — F17210 Nicotine dependence, cigarettes, uncomplicated: Secondary | ICD-10-CM | POA: Insufficient documentation

## 2015-09-17 DIAGNOSIS — R42 Dizziness and giddiness: Secondary | ICD-10-CM | POA: Diagnosis not present

## 2015-09-17 DIAGNOSIS — R55 Syncope and collapse: Secondary | ICD-10-CM | POA: Insufficient documentation

## 2015-09-17 DIAGNOSIS — Z79899 Other long term (current) drug therapy: Secondary | ICD-10-CM | POA: Diagnosis not present

## 2015-09-17 DIAGNOSIS — Z87442 Personal history of urinary calculi: Secondary | ICD-10-CM | POA: Insufficient documentation

## 2015-09-17 DIAGNOSIS — R61 Generalized hyperhidrosis: Secondary | ICD-10-CM | POA: Insufficient documentation

## 2015-09-17 LAB — CBC WITH DIFFERENTIAL/PLATELET
BASOS ABS: 0 10*3/uL (ref 0.0–0.1)
BASOS PCT: 0 %
Eosinophils Absolute: 0.1 10*3/uL (ref 0.0–0.7)
Eosinophils Relative: 1 %
HEMATOCRIT: 31.4 % — AB (ref 39.0–52.0)
HEMOGLOBIN: 10.6 g/dL — AB (ref 13.0–17.0)
LYMPHS PCT: 31 %
Lymphs Abs: 1.8 10*3/uL (ref 0.7–4.0)
MCH: 29.3 pg (ref 26.0–34.0)
MCHC: 33.8 g/dL (ref 30.0–36.0)
MCV: 86.7 fL (ref 78.0–100.0)
MONO ABS: 0.4 10*3/uL (ref 0.1–1.0)
MONOS PCT: 6 %
NEUTROS ABS: 3.5 10*3/uL (ref 1.7–7.7)
NEUTROS PCT: 62 %
Platelets: 159 10*3/uL (ref 150–400)
RBC: 3.62 MIL/uL — ABNORMAL LOW (ref 4.22–5.81)
RDW: 14.7 % (ref 11.5–15.5)
WBC: 5.7 10*3/uL (ref 4.0–10.5)

## 2015-09-17 LAB — BASIC METABOLIC PANEL
ANION GAP: 15 (ref 5–15)
BUN: 6 mg/dL (ref 6–20)
CHLORIDE: 110 mmol/L (ref 101–111)
CO2: 20 mmol/L — AB (ref 22–32)
Calcium: 9.2 mg/dL (ref 8.9–10.3)
Creatinine, Ser: 0.99 mg/dL (ref 0.61–1.24)
GLUCOSE: 106 mg/dL — AB (ref 65–99)
POTASSIUM: 3.2 mmol/L — AB (ref 3.5–5.1)
Sodium: 145 mmol/L (ref 135–145)

## 2015-09-17 LAB — I-STAT TROPONIN, ED: TROPONIN I, POC: 0 ng/mL (ref 0.00–0.08)

## 2015-09-17 MED ORDER — CYCLOBENZAPRINE HCL 10 MG PO TABS: 5.0000 mg | ORAL_TABLET | Freq: Once | ORAL | Status: DC

## 2015-09-17 MED ORDER — SODIUM CHLORIDE 0.9 % IV BOLUS (SEPSIS)
1000.0000 mL | Freq: Once | INTRAVENOUS | Status: AC
  Administered 2015-09-17: 1000 mL via INTRAVENOUS

## 2015-09-17 MED ORDER — POTASSIUM CHLORIDE CRYS ER 20 MEQ PO TBCR
40.0000 meq | EXTENDED_RELEASE_TABLET | Freq: Once | ORAL | Status: AC
  Administered 2015-09-17: 40 meq via ORAL
  Filled 2015-09-17: qty 2

## 2015-09-17 MED ORDER — ACETAMINOPHEN 500 MG PO TABS
500.0000 mg | ORAL_TABLET | Freq: Once | ORAL | Status: AC
  Administered 2015-09-17: 500 mg via ORAL
  Filled 2015-09-17: qty 1

## 2015-09-17 NOTE — Discharge Instructions (Signed)
Please follow-up with your orthopedic surgeon as previously scheduled. Please maintain good hydration over the next several days.  Syncope Syncope is a medical term for fainting or passing out. This means you lose consciousness and drop to the ground. People are generally unconscious for less than 5 minutes. You may have some muscle twitches for up to 15 seconds before waking up and returning to normal. Syncope occurs more often in older adults, but it can happen to anyone. While most causes of syncope are not dangerous, syncope can be a sign of a serious medical problem. It is important to seek medical care.  CAUSES  Syncope is caused by a sudden drop in blood flow to the brain. The specific cause is often not determined. Factors that can bring on syncope include:  Taking medicines that lower blood pressure.  Sudden changes in posture, such as standing up quickly.  Taking more medicine than prescribed.  Standing in one place for too long.  Seizure disorders.  Dehydration and excessive exposure to heat.  Low blood sugar (hypoglycemia).  Straining to have a bowel movement.  Heart disease, irregular heartbeat, or other circulatory problems.  Fear, emotional distress, seeing blood, or severe pain. SYMPTOMS  Right before fainting, you may:  Feel dizzy or light-headed.  Feel nauseous.  See all white or all black in your field of vision.  Have cold, clammy skin. DIAGNOSIS  Your health care provider will ask about your symptoms, perform a physical exam, and perform an electrocardiogram (ECG) to record the electrical activity of your heart. Your health care provider may also perform other heart or blood tests to determine the cause of your syncope which may include:  Transthoracic echocardiogram (TTE). During echocardiography, sound waves are used to evaluate how blood flows through your heart.  Transesophageal echocardiogram (TEE).  Cardiac monitoring. This allows your health  care provider to monitor your heart rate and rhythm in real time.  Holter monitor. This is a portable device that records your heartbeat and can help diagnose heart arrhythmias. It allows your health care provider to track your heart activity for several days, if needed.  Stress tests by exercise or by giving medicine that makes the heart beat faster. TREATMENT  In most cases, no treatment is needed. Depending on the cause of your syncope, your health care provider may recommend changing or stopping some of your medicines. HOME CARE INSTRUCTIONS  Have someone stay with you until you feel stable.  Do not drive, use machinery, or play sports until your health care provider says it is okay.  Keep all follow-up appointments as directed by your health care provider.  Lie down right away if you start feeling like you might faint. Breathe deeply and steadily. Wait until all the symptoms have passed.  Drink enough fluids to keep your urine clear or pale yellow.  If you are taking blood pressure or heart medicine, get up slowly and take several minutes to sit and then stand. This can reduce dizziness. SEEK IMMEDIATE MEDICAL CARE IF:   You have a severe headache.  You have unusual pain in the chest, abdomen, or back.  You are bleeding from your mouth or rectum, or you have black or tarry stool.  You have an irregular or very fast heartbeat.  You have pain with breathing.  You have repeated fainting or seizure-like jerking during an episode.  You faint when sitting or lying down.  You have confusion.  You have trouble walking.  You have severe weakness.  You have vision problems. If you fainted, call your local emergency services (911 in [place] Do not drive yourself to the hospital.    This information is not intended to replace advice given to you by your health care provider. Make sure you discuss any questions you have with your health care provider.   Document Released:  07/01/2005 Document Revised: 11/15/2014 Document Reviewed: 08/30/2011 Elsevier Interactive Patient Education [employer].
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
## 2015-09-17 NOTE — ED Notes (Signed)
Dr. Delo at bedside. 

## 2015-09-26 ENCOUNTER — Encounter (HOSPITAL_BASED_OUTPATIENT_CLINIC_OR_DEPARTMENT_OTHER): Payer: Self-pay | Admitting: *Deleted

## 2015-09-26 ENCOUNTER — Ambulatory Visit
Admission: RE | Admit: 2015-09-26 | Discharge: 2015-09-26 | Disposition: A | Discharge status: Home / Self Care | Payer: Medicaid Other | Source: Ambulatory Visit | Attending: Neurosurgery | Admitting: Neurosurgery

## 2015-09-26 DIAGNOSIS — S12500D Unspecified displaced fracture of sixth cervical vertebra, subsequent encounter for fracture with routine healing: Secondary | ICD-10-CM

## 2015-09-29 ENCOUNTER — Encounter (HOSPITAL_BASED_OUTPATIENT_CLINIC_OR_DEPARTMENT_OTHER)
Admission: RE | Disposition: A | Discharge status: Home / Self Care | Payer: Self-pay | Source: Ambulatory Visit | Attending: Orthopedic Surgery

## 2015-09-29 ENCOUNTER — Ambulatory Visit (HOSPITAL_BASED_OUTPATIENT_CLINIC_OR_DEPARTMENT_OTHER): Payer: Medicaid Other | Admitting: Anesthesiology

## 2015-09-29 ENCOUNTER — Ambulatory Visit (HOSPITAL_BASED_OUTPATIENT_CLINIC_OR_DEPARTMENT_OTHER)
Admission: RE | Admit: 2015-09-29 | Discharge: 2015-09-29 | Disposition: A | Discharge status: Home / Self Care | Payer: Medicaid Other | Source: Ambulatory Visit | Attending: Orthopedic Surgery | Admitting: Orthopedic Surgery

## 2015-09-29 ENCOUNTER — Encounter (HOSPITAL_BASED_OUTPATIENT_CLINIC_OR_DEPARTMENT_OTHER): Payer: Self-pay | Admitting: Anesthesiology

## 2015-09-29 DIAGNOSIS — Z79891 Long term (current) use of opiate analgesic: Secondary | ICD-10-CM | POA: Insufficient documentation

## 2015-09-29 DIAGNOSIS — Z79899 Other long term (current) drug therapy: Secondary | ICD-10-CM | POA: Diagnosis not present

## 2015-09-29 DIAGNOSIS — M24461 Recurrent dislocation, right knee: Secondary | ICD-10-CM | POA: Diagnosis not present

## 2015-09-29 DIAGNOSIS — F17200 Nicotine dependence, unspecified, uncomplicated: Secondary | ICD-10-CM | POA: Diagnosis not present

## 2015-09-29 DIAGNOSIS — Z96651 Presence of right artificial knee joint: Secondary | ICD-10-CM | POA: Diagnosis not present

## 2015-09-29 HISTORY — PX: HARDWARE REMOVAL: SHX979

## 2015-09-29 SURGERY — REMOVAL, HARDWARE
Anesthesia: General | Site: Leg Lower | Laterality: Right

## 2015-09-29 MED ORDER — OXYCODONE HCL 5 MG/5ML PO SOLN: 5.0000 mg | Freq: Once | ORAL | Status: AC | PRN

## 2015-09-29 MED ORDER — OXYCODONE HCL 5 MG PO TABS
5.0000 mg | ORAL_TABLET | Freq: Once | ORAL | Status: AC | PRN
  Administered 2015-09-29: 5 mg via ORAL

## 2015-09-29 MED ORDER — FENTANYL CITRATE (PF) 100 MCG/2ML IJ SOLN
50.0000 ug | INTRAMUSCULAR | Status: DC | PRN
  Administered 2015-09-29: 100 ug via INTRAVENOUS

## 2015-09-29 MED ORDER — OXYCODONE HCL 5 MG PO TABS
ORAL_TABLET | ORAL | Status: AC
  Filled 2015-09-29: qty 1

## 2015-09-29 MED ORDER — PROPOFOL 500 MG/50ML IV EMUL
INTRAVENOUS | Status: DC | PRN
  Administered 2015-09-29: 100 ug/kg/min via INTRAVENOUS

## 2015-09-29 MED ORDER — ACETAMINOPHEN 500 MG PO TABS
ORAL_TABLET | ORAL | Status: AC
  Filled 2015-09-29: qty 2

## 2015-09-29 MED ORDER — LIDOCAINE HCL (CARDIAC) 20 MG/ML IV SOLN
INTRAVENOUS | Status: AC
  Filled 2015-09-29: qty 5

## 2015-09-29 MED ORDER — SCOPOLAMINE 1 MG/3DAYS TD PT72: 1.0000 | MEDICATED_PATCH | Freq: Once | TRANSDERMAL | Status: DC | PRN

## 2015-09-29 MED ORDER — MIDAZOLAM HCL 2 MG/2ML IJ SOLN
INTRAMUSCULAR | Status: AC
  Filled 2015-09-29: qty 2

## 2015-09-29 MED ORDER — BUPIVACAINE HCL (PF) 0.5 % IJ SOLN
INTRAMUSCULAR | Status: AC
  Filled 2015-09-29: qty 30

## 2015-09-29 MED ORDER — FENTANYL CITRATE (PF) 100 MCG/2ML IJ SOLN
INTRAMUSCULAR | Status: AC
  Filled 2015-09-29: qty 2

## 2015-09-29 MED ORDER — ACETAMINOPHEN 500 MG PO TABS
1000.0000 mg | ORAL_TABLET | Freq: Once | ORAL | Status: AC
  Administered 2015-09-29: 1000 mg via ORAL

## 2015-09-29 MED ORDER — MIDAZOLAM HCL 2 MG/2ML IJ SOLN
1.0000 mg | INTRAMUSCULAR | Status: DC | PRN
  Administered 2015-09-29: 2 mg via INTRAVENOUS

## 2015-09-29 MED ORDER — CHLORHEXIDINE GLUCONATE 4 % EX LIQD: 60.0000 mL | Freq: Once | CUTANEOUS | Status: DC

## 2015-09-29 MED ORDER — GLYCOPYRROLATE 0.2 MG/ML IJ SOLN: 0.2000 mg | Freq: Once | INTRAMUSCULAR | Status: DC | PRN

## 2015-09-29 MED ORDER — LACTATED RINGERS IV SOLN
INTRAVENOUS | Status: DC
  Administered 2015-09-29: 12:00:00 via INTRAVENOUS

## 2015-09-29 MED ORDER — ONDANSETRON HCL 4 MG/2ML IJ SOLN
INTRAMUSCULAR | Status: DC | PRN
  Administered 2015-09-29: 4 mg via INTRAVENOUS

## 2015-09-29 MED ORDER — LIDOCAINE HCL (CARDIAC) 20 MG/ML IV SOLN
INTRAVENOUS | Status: DC | PRN
  Administered 2015-09-29: 50 mg via INTRAVENOUS

## 2015-09-29 MED ORDER — OXYCODONE-ACETAMINOPHEN 5-325 MG PO TABS: 1.0000 | ORAL_TABLET | Freq: Four times a day (QID) | ORAL | Status: DC | PRN

## 2015-09-29 MED ORDER — POTASSIUM CHLORIDE IN NACL 20-0.45 MEQ/L-% IV SOLN: INTRAVENOUS | Status: DC

## 2015-09-29 MED ORDER — MEPERIDINE HCL 25 MG/ML IJ SOLN: 6.2500 mg | INTRAMUSCULAR | Status: DC | PRN

## 2015-09-29 MED ORDER — CEFAZOLIN SODIUM-DEXTROSE 2-3 GM-% IV SOLR
INTRAVENOUS | Status: AC
  Filled 2015-09-29: qty 50

## 2015-09-29 MED ORDER — CEFAZOLIN SODIUM-DEXTROSE 2-3 GM-% IV SOLR
2.0000 g | INTRAVENOUS | Status: AC
  Administered 2015-09-29: 2 g via INTRAVENOUS

## 2015-09-29 MED ORDER — HYDROMORPHONE HCL 1 MG/ML IJ SOLN: 0.2500 mg | INTRAMUSCULAR | Status: DC | PRN

## 2015-09-29 SURGICAL SUPPLY — 71 items
BANDAGE ACE 4X5 VEL STRL LF (GAUZE/BANDAGES/DRESSINGS) ×5 IMPLANT
BANDAGE ADH SHEER 1  50/CT (GAUZE/BANDAGES/DRESSINGS) ×2 IMPLANT
BLADE SURG 15 STRL LF DISP TIS (BLADE) ×1 IMPLANT
BLADE SURG 15 STRL SS (BLADE)
BNDG CMPR 9X4 STRL LF SNTH (GAUZE/BANDAGES/DRESSINGS)
BNDG COHESIVE 4X5 TAN STRL (GAUZE/BANDAGES/DRESSINGS) ×1 IMPLANT
BNDG ESMARK 4X9 LF (GAUZE/BANDAGES/DRESSINGS) ×1 IMPLANT
CHLORAPREP W/TINT 26ML (MISCELLANEOUS) ×1 IMPLANT
CLOSURE STERI-STRIP 1/2X4 (GAUZE/BANDAGES/DRESSINGS)
CLSR STERI-STRIP ANTIMIC 1/2X4 (GAUZE/BANDAGES/DRESSINGS) ×1 IMPLANT
COVER BACK TABLE 60X90IN (DRAPES) ×1 IMPLANT
CUFF TOURNIQUET SINGLE 24IN (TOURNIQUET CUFF) IMPLANT
CUFF TOURNIQUET SINGLE 34IN LL (TOURNIQUET CUFF) IMPLANT
DECANTER SPIKE VIAL GLASS SM (MISCELLANEOUS) IMPLANT
DRAPE EXTREMITY T 121X128X90 (DRAPE) ×1 IMPLANT
DRAPE IMP U-DRAPE 54X76 (DRAPES) ×1 IMPLANT
DRAPE OEC MINIVIEW 54X84 (DRAPES) ×1 IMPLANT
DRAPE SURG 17X23 STRL (DRAPES) IMPLANT
DRAPE U-SHAPE 47X51 STRL (DRAPES) IMPLANT
DRSG ADAPTIC 3X8 NADH LF (GAUZE/BANDAGES/DRESSINGS) ×2 IMPLANT
DRSG EMULSION OIL 3X3 NADH (GAUZE/BANDAGES/DRESSINGS) ×1 IMPLANT
DRSG MEPILEX BORDER 4X8 (GAUZE/BANDAGES/DRESSINGS) ×2 IMPLANT
DRSG PAD ABDOMINAL 8X10 ST (GAUZE/BANDAGES/DRESSINGS) ×2 IMPLANT
ELECT REM PT RETURN 9FT ADLT (ELECTROSURGICAL)
ELECTRODE REM PT RTRN 9FT ADLT (ELECTROSURGICAL) ×1 IMPLANT
GAUZE SPONGE 4X4 12PLY STRL (GAUZE/BANDAGES/DRESSINGS) ×1 IMPLANT
GAUZE XEROFORM 1X8 LF (GAUZE/BANDAGES/DRESSINGS) IMPLANT
GLOVE BIO SURGEON STRL SZ7 (GLOVE) ×1 IMPLANT
GLOVE BIO SURGEON STRL SZ7.5 (GLOVE) ×3 IMPLANT
GLOVE BIOGEL PI IND STRL 7.0 (GLOVE) ×1 IMPLANT
GLOVE BIOGEL PI IND STRL 8 (GLOVE) ×1 IMPLANT
GLOVE BIOGEL PI INDICATOR 7.0 (GLOVE) ×2
GLOVE BIOGEL PI INDICATOR 8 (GLOVE)
GLOVE ECLIPSE 6.5 STRL STRAW (GLOVE) ×2 IMPLANT
GOWN STRL REUS W/ TWL LRG LVL3 (GOWN DISPOSABLE) ×4 IMPLANT
GOWN STRL REUS W/ TWL XL LVL3 (GOWN DISPOSABLE) ×1 IMPLANT
GOWN STRL REUS W/TWL LRG LVL3 (GOWN DISPOSABLE)
GOWN STRL REUS W/TWL XL LVL3 (GOWN DISPOSABLE)
IMMOBILIZER KNEE 24 THIGH 36 (MISCELLANEOUS) IMPLANT
IMMOBILIZER KNEE 24 UNIV (MISCELLANEOUS) ×3
NDL HYPO 25X1 1.5 SAFETY (NEEDLE) ×1 IMPLANT
NEEDLE HYPO 25X1 1.5 SAFETY (NEEDLE) IMPLANT
NS IRRIG 1000ML POUR BTL (IV SOLUTION) ×1 IMPLANT
PACK BASIN DAY SURGERY FS (CUSTOM PROCEDURE TRAY) ×3 IMPLANT
PAD CAST 4YDX4 CTTN HI CHSV (CAST SUPPLIES) ×1 IMPLANT
PADDING CAST ABS 4INX4YD NS (CAST SUPPLIES)
PADDING CAST ABS COTTON 4X4 ST (CAST SUPPLIES) ×1 IMPLANT
PADDING CAST COTTON 4X4 STRL (CAST SUPPLIES) ×3
PENCIL BUTTON HOLSTER BLD 10FT (ELECTRODE) ×1 IMPLANT
SHEET MEDIUM DRAPE 40X70 STRL (DRAPES) IMPLANT
SLEEVE SCD COMPRESS KNEE MED (MISCELLANEOUS) IMPLANT
SPONGE LAP 4X18 X RAY DECT (DISPOSABLE) ×1 IMPLANT
STOCKINETTE 4X48 STRL (DRAPES) IMPLANT
STOCKINETTE 6  STRL (DRAPES)
STOCKINETTE 6 STRL (DRAPES) ×1 IMPLANT
SUCTION FRAZIER HANDLE 10FR (MISCELLANEOUS)
SUCTION TUBE FRAZIER 10FR DISP (MISCELLANEOUS) IMPLANT
SUT ETHILON 3 0 PS 1 (SUTURE) IMPLANT
SUT MON AB 4-0 PC3 18 (SUTURE) IMPLANT
SUT PROLENE 3 0 PS 2 (SUTURE) IMPLANT
SUT VIC AB 2-0 SH 27 (SUTURE)
SUT VIC AB 2-0 SH 27XBRD (SUTURE) IMPLANT
SUT VIC AB 3-0 FS2 27 (SUTURE) IMPLANT
SYR BULB 3OZ (MISCELLANEOUS) ×1 IMPLANT
SYR CONTROL 10ML LL (SYRINGE) IMPLANT
TOWEL OR 17X24 6PK STRL BLUE (TOWEL DISPOSABLE) ×3 IMPLANT
TOWEL OR NON WOVEN STRL DISP B (DISPOSABLE) ×1 IMPLANT
TRAY DSU PREP LF (CUSTOM PROCEDURE TRAY) ×2 IMPLANT
TUBE CONNECTING 20'X1/4 (TUBING)
TUBE CONNECTING 20X1/4 (TUBING) IMPLANT
UNDERPAD 30X30 (UNDERPADS AND DIAPERS) ×3 IMPLANT

## 2015-09-29 NOTE — Transfer of Care (Signed)
Immediate Anesthesia Transfer of Care Note  Patient: Wesley Prince  Procedure(s) Performed: Procedure(s): RIGHT LEG REMOVAL EXTERNAL FIXATION  (Right)  Patient Location: PACU  Anesthesia Type:MAC  Level of Consciousness: awake and alert   Airway & Oxygen Therapy: Patient Spontanous Breathing and Patient connected to face mask oxygen  Post-op Assessment: Report given to RN and Post -op Vital signs reviewed and stable  Post vital signs: Reviewed and stable  Last Vitals:  Filed Vitals:   09/29/15 1132  BP: 137/96  Pulse: 60  Temp: 36.8 C  Resp: 18    Complications: No apparent anesthesia complications

## 2015-09-29 NOTE — Anesthesia Postprocedure Evaluation (Signed)
Anesthesia Post Note  Patient: Wesley Prince  Procedure(s) Performed: Procedure(s) (LRB): RIGHT LEG REMOVAL EXTERNAL FIXATION  (Right)  Patient location during evaluation: PACU Anesthesia Type: General Level of consciousness: awake and alert Pain management: pain level controlled Vital Signs Assessment: post-procedure vital signs reviewed and stable Respiratory status: spontaneous breathing, nonlabored ventilation and respiratory function stable Cardiovascular status: blood pressure returned to baseline and stable Postop Assessment: no signs of nausea or vomiting Anesthetic complications: no    Last Vitals:  Filed Vitals:   09/29/15 1252 09/29/15 1300  BP: 114/85 106/90  Pulse: 72 61  Temp: 36.6 C   Resp: 19 12    Last Pain:  Filed Vitals:   09/29/15 1313  PainSc: 5                  Miguelangel Korn A

## 2015-09-29 NOTE — Anesthesia Preprocedure Evaluation (Signed)
Anesthesia Evaluation  Patient identified by MRN, date of birth, ID band Patient awake    Reviewed: Allergy & Precautions, NPO status , Patient's Chart, lab work & pertinent test results  Airway Mallampati: I  TM Distance: >3 FB Neck ROM: Full    Dental  (+) Teeth Intact, Dental Advisory Given   Pulmonary Current Smoker,    breath sounds clear to auscultation       Cardiovascular  Rhythm:Regular Rate:Normal     Neuro/Psych    GI/Hepatic PUD,   Endo/Other    Renal/GU      Musculoskeletal   Abdominal   Peds  Hematology   Anesthesia Other Findings   Reproductive/Obstetrics                             Anesthesia Physical Anesthesia Plan  ASA: I  Anesthesia Plan: General   Post-op Pain Management:    Induction: Intravenous  Airway Management Planned: LMA  Additional Equipment:   Intra-op Plan:   Post-operative Plan: Extubation in OR  Informed Consent: I have reviewed the patients History and Physical, chart, labs and discussed the procedure including the risks, benefits and alternatives for the proposed anesthesia with the patient or authorized representative who has indicated his/her understanding and acceptance.   Dental advisory given  Plan Discussed with: CRNA, Anesthesiologist and Surgeon  Anesthesia Plan Comments:         Anesthesia Quick Evaluation

## 2015-09-29 NOTE — Addendum Note (Signed)
Addendum  created 09/29/15 1317 by Lorrene Reid, MD   Modules edited: Orders, PRL Based Order Sets

## 2015-09-29 NOTE — Op Note (Signed)
09/29/2015  12:49 PM  PATIENT:  Wesley Prince    PRE-OPERATIVE DIAGNOSIS:  RECURRENT DISLOCATION RIGHT KNEE   POST-OPERATIVE DIAGNOSIS:  Same  PROCEDURE:  RIGHT LEG REMOVAL EXTERNAL FIXATION   SURGEON:  Allante Beane, Ernesta Amble, MD  ASSISTANT: Nehemiah Massed PA-c  ANESTHESIA:   sedation  PREOPERATIVE INDICATIONS:  [name] is a  55 y.o. male with a diagnosis of RECURRENT DISLOCATION RIGHT KNEE  who failed conservative measures and elected for surgical management.    The risks benefits and alternatives were discussed with the patient preoperatively including but not limited to the risks of infection, bleeding, nerve injury, cardiopulmonary complications, the need for revision surgery, among others, and the patient was willing to proceed.  OPERATIVE IMPLANTS: none  OPERATIVE FINDINGS: stable  BLOOD LOSS: min  COMPLICATIONS: none  TOURNIQUET TIME: none  OPERATIVE PROCEDURE:  Patient was identified in the preoperative holding area and site was marked by me He was transported to the operating theater and placed on the table in supine position taking care to pad all bony prominences. After a preincinduction time out anesthesia was induced. The right extremity was prepped with betadine and a pre-incision timeout was performed. He received ancef for preoperative antibiotics.   I removed his entire external fixator without complications.  Knee felt stable  I placed a sterile dressing and a knee immobilizer  POST OPERATIVE PLAN: NWB, knee immobilizer full time    This note was generated using a template and dragon dictation system. In light of that, I have reviewed the note and all aspects of it are applicable to this case. Any dictation errors are due to the computerized dictation system.

## 2015-09-29 NOTE — Anesthesia Procedure Notes (Signed)
Procedure Name: MAC Date/Time: 09/29/2015 12:25 PM Performed by: Lieutenant Diego Pre-anesthesia Checklist: Patient identified, Timeout performed, Emergency Drugs available, Suction available and Patient being monitored Patient Re-evaluated:Patient Re-evaluated prior to inductionOxygen Delivery Method: Simple face mask Preoxygenation: Pre-oxygenation with 100% oxygen Intubation Type: IV induction

## 2015-09-29 NOTE — Discharge Instructions (Signed)
Keep your leg in the knee immobilizer until follow up appointment in 7-10 days Keep right leg elevated, avoiding pressure on heel No weight bearing on right leg-use crutches Keep wounds where fixation removed clean and dry   Hardware Removal, Care After Refer to this sheet in the next few weeks. These instructions provide you with information about caring for yourself after your procedure. Your health care provider may also give you more specific instructions. Your treatment has been planned according to current medical practices, but problems sometimes occur. Call your health care provider if you have any problems or questions after your procedure. WHAT TO EXPECT AFTER THE PROCEDURE After your procedure, it is common to have:  Some pain.  Nausea.  Some swelling in the area where hardware was removed. HOME CARE INSTRUCTIONS  Take medicines only as directed by your health care provider.  Follow instructions from your health care provider about:  Rest.  Physical activity.  Bearing weight. SEEK MEDICAL CARE IF:  You have lasting pain.  You are unable to perform exercises or physical activity as directed by your health care provider. SEEK IMMEDIATE MEDICAL CARE IF:  You have severe pain.  You have a fever or chills.  You have redness, heat, swelling, or pain at the site of your incision.  You have fluid, blood, or pus coming from your incision.  You have difficulty breathing.  You cannot pass gas.  You are unable to have a bowel movement within 2 days.  You have numbness for more than 24 hours in the area where hardware was removed.   This information is not intended to replace advice given to you by your health care provider. Make sure you discuss any questions you have with your health care provider.   Document Released: 11/15/2014 Document Reviewed: 11/15/2014 Elsevier Interactive Patient Education 2016 [facility]  Instructions  Activity: Get plenty of rest for the remainder of the day. A responsible adult should stay with you for 24 hours following the procedure.  For the next 24 hours, DO NOT: -Drive a car -Paediatric nurse -Drink alcoholic beverages -Take any medication unless instructed by your physician -Make any legal decisions or sign important papers.  Meals: Start with liquid foods such as gelatin or soup. Progress to regular foods as tolerated. Avoid greasy, spicy, heavy foods. If nausea and/or vomiting occur, drink only clear liquids until the nausea and/or vomiting subsides. Call your physician if vomiting continues.  Special Instructions/Symptoms: Your throat may feel dry or sore from the anesthesia or the breathing tube placed in your throat during surgery. If this causes discomfort, gargle with warm salt water. The discomfort should disappear within 24 hours.  If you had a scopolamine patch placed behind your ear for the management of post- operative nausea and/or vomiting:  1. The medication in the patch is effective for 72 hours, after which it should be removed.  Wrap patch in a tissue and discard in the trash. Wash hands thoroughly with soap and water. 2. You may remove the patch earlier than 72 hours if you experience unpleasant side effects which may include dry mouth, dizziness or visual disturbances. 3. Avoid touching the patch. Wash your hands with soap and water after contact with the patch.

## 2015-09-29 NOTE — Interval H&P Note (Signed)
History and Physical Interval Note:  09/29/2015 8:59 AM  Wesley Prince  has presented today for surgery, with the diagnosis of RECURRENT DISLOCATION RIGHT KNEE   The various methods of treatment have been discussed with the patient and family. After consideration of risks, benefits and other options for treatment, the patient has consented to  Procedure(s): RIGHT LEG REMOVAL EXTERNAL FIXATION  (Right) as a surgical intervention .  The patient's history has been reviewed, patient examined, no change in status, stable for surgery.  I have reviewed the patient's chart and labs.  Questions were answered to the patient's satisfaction.     [name]

## 2015-10-02 ENCOUNTER — Encounter (HOSPITAL_BASED_OUTPATIENT_CLINIC_OR_DEPARTMENT_OTHER): Payer: Self-pay | Admitting: Orthopedic Surgery

## 2015-10-16 ENCOUNTER — Ambulatory Visit: Payer: No Typology Code available for payment source

## 2015-10-21 ENCOUNTER — Emergency Department (HOSPITAL_COMMUNITY): Payer: Medicaid Other

## 2015-10-21 ENCOUNTER — Encounter (HOSPITAL_COMMUNITY): Payer: Self-pay | Admitting: Emergency Medicine

## 2015-10-21 ENCOUNTER — Emergency Department (HOSPITAL_COMMUNITY)
Admission: EM | Admit: 2015-10-21 | Discharge: 2015-10-21 | Disposition: A | Discharge status: Home / Self Care | Payer: Medicaid Other | Attending: Emergency Medicine | Admitting: Emergency Medicine

## 2015-10-21 DIAGNOSIS — M25531 Pain in right wrist: Secondary | ICD-10-CM | POA: Diagnosis not present

## 2015-10-21 DIAGNOSIS — R079 Chest pain, unspecified: Secondary | ICD-10-CM | POA: Insufficient documentation

## 2015-10-21 DIAGNOSIS — M25511 Pain in right shoulder: Secondary | ICD-10-CM | POA: Insufficient documentation

## 2015-10-21 DIAGNOSIS — Z87828 Personal history of other (healed) physical injury and trauma: Secondary | ICD-10-CM | POA: Diagnosis not present

## 2015-10-21 DIAGNOSIS — R0781 Pleurodynia: Secondary | ICD-10-CM

## 2015-10-21 DIAGNOSIS — Z8719 Personal history of other diseases of the digestive system: Secondary | ICD-10-CM | POA: Insufficient documentation

## 2015-10-21 DIAGNOSIS — G8929 Other chronic pain: Secondary | ICD-10-CM | POA: Diagnosis not present

## 2015-10-21 DIAGNOSIS — Z87442 Personal history of urinary calculi: Secondary | ICD-10-CM | POA: Insufficient documentation

## 2015-10-21 DIAGNOSIS — Z79899 Other long term (current) drug therapy: Secondary | ICD-10-CM | POA: Insufficient documentation

## 2015-10-21 DIAGNOSIS — Z8739 Personal history of other diseases of the musculoskeletal system and connective tissue: Secondary | ICD-10-CM | POA: Diagnosis not present

## 2015-10-21 DIAGNOSIS — F17210 Nicotine dependence, cigarettes, uncomplicated: Secondary | ICD-10-CM | POA: Insufficient documentation

## 2015-10-21 DIAGNOSIS — M542 Cervicalgia: Secondary | ICD-10-CM | POA: Insufficient documentation

## 2015-10-21 MED ORDER — ONDANSETRON 4 MG PO TBDP
4.0000 mg | ORAL_TABLET | Freq: Once | ORAL | Status: AC
  Administered 2015-10-21: 4 mg via ORAL
  Filled 2015-10-21: qty 1

## 2015-10-21 MED ORDER — OXYCODONE-ACETAMINOPHEN 5-325 MG PO TABS: 1.0000 | ORAL_TABLET | Freq: Four times a day (QID) | ORAL | Status: DC | PRN

## 2015-10-21 MED ORDER — ONDANSETRON 4 MG PO TBDP: 4.0000 mg | ORAL_TABLET | Freq: Three times a day (TID) | ORAL | Status: DC | PRN

## 2015-10-21 MED ORDER — METHOCARBAMOL 500 MG PO TABS: 1000.0000 mg | ORAL_TABLET | Freq: Four times a day (QID) | ORAL | Status: DC

## 2015-10-21 MED ORDER — HYDROMORPHONE HCL 2 MG/ML IJ SOLN
2.0000 mg | Freq: Once | INTRAMUSCULAR | Status: AC
  Administered 2015-10-21: 2 mg via INTRAMUSCULAR
  Filled 2015-10-21: qty 1
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
## 2015-10-21 NOTE — ED Notes (Signed)
Pt request zofran; pt given zofran prescription prior to discharge from hospital.

## 2015-10-21 NOTE — Discharge Instructions (Signed)
Please read and follow all provided instructions.  Your diagnoses today include:  1. Neck pain   2. Rib pain on right side     Tests performed today include:  Vital signs - see below for your results today  X-ray of neck - shows some narrowing where nerves come out of spinal cord  X-ray of ribs - shows improving rib fractures, no infections  Medications prescribed:   Percocet (oxycodone/acetaminophen) - narcotic pain medication  DO NOT drive or perform any activities that require you to be awake and alert because this medicine can make you drowsy. BE VERY CAREFUL not to take multiple medicines containing Tylenol (also called acetaminophen). Doing so can lead to an overdose which can damage your liver and cause liver failure and possibly death.   Robaxin (methocarbamol) - muscle relaxer medication  DO NOT drive or perform any activities that require you to be awake and alert because this medicine can make you drowsy.   Take any prescribed medications only as directed.  Home care instructions:   Follow any educational materials contained in this packet  Please rest, use ice or heat on your back for the next several days  Do not lift, push, pull anything more than 10 pounds for the next week  Follow-up instructions: Please follow-up with your doctors as planned for further evaluation of your symptoms.   Return instructions:  SEEK IMMEDIATE MEDICAL ATTENTION IF YOU HAVE:  New numbness, tingling, weakness, or problem with the use of your arms or legs  Severe back pain not relieved with medications  Loss control of your bowels or bladder  Increasing pain in any areas of the body (such as chest or abdominal pain)  Shortness of breath, dizziness, or fainting.   Worsening nausea (feeling sick to your stomach), vomiting, fever, or sweats  Any other emergent concerns regarding your health   Additional Information:  Your vital signs today were: BP 123/89 mmHg   Pulse 79    Temp(Src) 98.7 F (37.1 C) (Oral)   Resp 18   Ht 5\' 10"  (1.778 m)   Wt 81.647 kg   BMI 25.83 kg/m2   SpO2 98% If your blood pressure (BP) was elevated above 135/85 this visit, please have this repeated by your doctor within one month. --------------
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
## 2015-10-21 NOTE — ED Notes (Signed)
Pt transported to DG at present time; will administer medication as ordered with pt return.

## 2015-10-25 ENCOUNTER — Ambulatory Visit: Payer: Medicaid Other | Attending: Orthopedic Surgery

## 2015-10-25 DIAGNOSIS — M25661 Stiffness of right knee, not elsewhere classified: Secondary | ICD-10-CM | POA: Insufficient documentation

## 2015-10-25 DIAGNOSIS — R600 Localized edema: Secondary | ICD-10-CM | POA: Insufficient documentation

## 2015-10-25 DIAGNOSIS — M25561 Pain in right knee: Secondary | ICD-10-CM | POA: Insufficient documentation

## 2015-10-25 DIAGNOSIS — R262 Difficulty in walking, not elsewhere classified: Secondary | ICD-10-CM | POA: Diagnosis present
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
## 2015-10-25 NOTE — Patient Instructions (Signed)
ROM: Inversion / Eversion   With left leg relaxed, gently turn ankle and foot in and out. Move through full range of motion. Avoid pain. Repeat __10-20__ times per set. Do _1___ sets per session. Do __2__ sessions per day.  http://orth.exer.us/36   Copyright  VHI. All rights reserved.  ROM: Plantar / Dorsiflexion   With left leg relaxed, gently flex and extend ankle. Move through full range of motion. Avoid pain. Repeat _10-20___ times per set. Do __1-2__ sets per session. Do __2__ sessions per day.  http://orth.exer.us/34   Copyright  VHI. All rights reserved.  Ankle Alphabet   Using left ankle and foot only, trace the letters of the alphabet. Perform A to Z. Repeat __1__ times per set. Do _1___ sets per session. Do __2__ sessions per day.  http://orth.exer.us/16   Copyright  VHI. All rights reserved.  Ankle Circles   Slowly rotate right foot and ankle clockwise then counterclockwise. Gradually increase range of motion. Avoid pain. Circle _10-20___ times each direction per set. Do _1-2___ sets per session. Do _2___ sessions per day.  http://orth.exer.us/30   Copyright  VHI. All rights reserved.  Hip Flexion / Knee Extension: Straight-Leg Raise (Eccentric)   Lie on back. Lift leg with knee straight. Slowly lower leg for 3-5 seconds. __10-20_ reps per set, __2_ sets per day, _7__ days per week. Lower like elevator, stopping at each floor. Add ___ lbs when you achieve ___ repetitions. Rest on elbows. Rest on straight arms.  ABDUCTION: Side-Lying (Active)   Lie on left side, top leg straight. Raise top leg as far as possible. Use ___ lbs. Complete _1__ sets of _10-20__ repetitions. Perform _2__ sessions per day.  http://gtsc.exer.us/94    ADDUCTION: Side-Lying (Active)   Lie on right side, with top leg bent and in front of other leg. Lift straight leg up as high as possible. Use ___ lbs. Complete __1_ sets of __10-20_ repetitions. Perform __2_ sessions per  day.  http://gtsc.exer.us/129   Copyright  VHI. All rights reserved.  .quadsets x 25 5 sec hold  2x/day  Issued from cabinet

## 2015-11-07 ENCOUNTER — Ambulatory Visit: Payer: Medicaid Other | Admitting: Physical Therapy

## 2015-11-07 DIAGNOSIS — M25561 Pain in right knee: Secondary | ICD-10-CM

## 2015-11-07 DIAGNOSIS — R600 Localized edema: Secondary | ICD-10-CM

## 2015-11-07 DIAGNOSIS — M25661 Stiffness of right knee, not elsewhere classified: Secondary | ICD-10-CM

## 2015-11-07 DIAGNOSIS — R262 Difficulty in walking, not elsewhere classified: Secondary | ICD-10-CM
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
## 2015-11-08 ENCOUNTER — Ambulatory Visit: Payer: Medicaid Other | Admitting: Physical Therapy

## 2015-11-08 DIAGNOSIS — R262 Difficulty in walking, not elsewhere classified: Secondary | ICD-10-CM

## 2015-11-08 DIAGNOSIS — R600 Localized edema: Secondary | ICD-10-CM

## 2015-11-08 DIAGNOSIS — M25661 Stiffness of right knee, not elsewhere classified: Secondary | ICD-10-CM

## 2015-11-08 DIAGNOSIS — M25561 Pain in right knee: Secondary | ICD-10-CM | POA: Diagnosis not present
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
## 2015-11-10 ENCOUNTER — Ambulatory Visit: Payer: Medicaid Other | Admitting: Physical Therapy

## 2015-11-10 DIAGNOSIS — M25561 Pain in right knee: Secondary | ICD-10-CM | POA: Diagnosis not present

## 2015-11-10 DIAGNOSIS — R262 Difficulty in walking, not elsewhere classified: Secondary | ICD-10-CM

## 2015-11-10 DIAGNOSIS — M25661 Stiffness of right knee, not elsewhere classified: Secondary | ICD-10-CM

## 2015-11-10 DIAGNOSIS — R600 Localized edema: Secondary | ICD-10-CM
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
## 2015-11-13 ENCOUNTER — Ambulatory Visit: Payer: Medicaid Other | Attending: Orthopedic Surgery | Admitting: Physical Therapy

## 2015-11-13 DIAGNOSIS — M25661 Stiffness of right knee, not elsewhere classified: Secondary | ICD-10-CM | POA: Diagnosis not present

## 2015-11-13 DIAGNOSIS — R600 Localized edema: Secondary | ICD-10-CM | POA: Diagnosis present

## 2015-11-13 DIAGNOSIS — R262 Difficulty in walking, not elsewhere classified: Secondary | ICD-10-CM | POA: Diagnosis present

## 2015-11-13 DIAGNOSIS — M25561 Pain in right knee: Secondary | ICD-10-CM | POA: Diagnosis present
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
## 2015-11-14 ENCOUNTER — Encounter: Payer: Medicaid Other | Admitting: Physical Therapy

## 2015-11-16 ENCOUNTER — Encounter: Payer: Medicaid Other | Admitting: Physical Therapy

## 2015-11-20 ENCOUNTER — Ambulatory Visit: Payer: Medicaid Other | Admitting: Physical Therapy

## 2015-11-21 ENCOUNTER — Ambulatory Visit: Payer: Medicaid Other | Admitting: Physical Therapy

## 2015-11-21 DIAGNOSIS — R262 Difficulty in walking, not elsewhere classified: Secondary | ICD-10-CM

## 2015-11-21 DIAGNOSIS — M25661 Stiffness of right knee, not elsewhere classified: Secondary | ICD-10-CM | POA: Diagnosis not present

## 2015-11-21 DIAGNOSIS — R600 Localized edema: Secondary | ICD-10-CM

## 2015-11-21 DIAGNOSIS — M25561 Pain in right knee: Secondary | ICD-10-CM
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
## 2015-11-24 ENCOUNTER — Ambulatory Visit: Payer: Medicaid Other | Admitting: Physical Therapy

## 2015-11-24 DIAGNOSIS — M25661 Stiffness of right knee, not elsewhere classified: Secondary | ICD-10-CM

## 2015-11-24 DIAGNOSIS — M25561 Pain in right knee: Secondary | ICD-10-CM

## 2015-11-24 DIAGNOSIS — R262 Difficulty in walking, not elsewhere classified: Secondary | ICD-10-CM

## 2015-11-24 DIAGNOSIS — R600 Localized edema: Secondary | ICD-10-CM
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
## 2015-11-29 ENCOUNTER — Ambulatory Visit: Payer: Medicaid Other | Admitting: Physical Therapy

## 2015-11-30 ENCOUNTER — Ambulatory Visit: Payer: Medicaid Other | Admitting: Physical Therapy

## 2015-11-30 DIAGNOSIS — R600 Localized edema: Secondary | ICD-10-CM

## 2015-11-30 DIAGNOSIS — M25561 Pain in right knee: Secondary | ICD-10-CM

## 2015-11-30 DIAGNOSIS — M25661 Stiffness of right knee, not elsewhere classified: Secondary | ICD-10-CM

## 2015-11-30 DIAGNOSIS — R262 Difficulty in walking, not elsewhere classified: Secondary | ICD-10-CM
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
## 2015-12-01 ENCOUNTER — Ambulatory Visit: Payer: Medicaid Other | Admitting: Physical Therapy

## 2015-12-01 DIAGNOSIS — M25661 Stiffness of right knee, not elsewhere classified: Secondary | ICD-10-CM

## 2015-12-01 DIAGNOSIS — M25561 Pain in right knee: Secondary | ICD-10-CM

## 2015-12-01 DIAGNOSIS — R600 Localized edema: Secondary | ICD-10-CM

## 2015-12-01 DIAGNOSIS — R262 Difficulty in walking, not elsewhere classified: Secondary | ICD-10-CM
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
## 2015-12-04 ENCOUNTER — Ambulatory Visit: Payer: Medicaid Other | Admitting: Physical Therapy

## 2015-12-04 DIAGNOSIS — M25661 Stiffness of right knee, not elsewhere classified: Secondary | ICD-10-CM

## 2015-12-04 DIAGNOSIS — R600 Localized edema: Secondary | ICD-10-CM

## 2015-12-04 DIAGNOSIS — M25561 Pain in right knee: Secondary | ICD-10-CM

## 2015-12-04 DIAGNOSIS — R262 Difficulty in walking, not elsewhere classified: Secondary | ICD-10-CM
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
## 2015-12-06 ENCOUNTER — Ambulatory Visit: Payer: Medicaid Other | Admitting: Physical Therapy

## 2015-12-06 DIAGNOSIS — M25561 Pain in right knee: Secondary | ICD-10-CM

## 2015-12-06 DIAGNOSIS — R262 Difficulty in walking, not elsewhere classified: Secondary | ICD-10-CM

## 2015-12-06 DIAGNOSIS — M25661 Stiffness of right knee, not elsewhere classified: Secondary | ICD-10-CM

## 2015-12-06 DIAGNOSIS — R600 Localized edema: Secondary | ICD-10-CM
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
## 2015-12-08 ENCOUNTER — Ambulatory Visit: Payer: Medicaid Other | Admitting: Physical Therapy

## 2015-12-08 DIAGNOSIS — M25661 Stiffness of right knee, not elsewhere classified: Secondary | ICD-10-CM | POA: Diagnosis not present

## 2015-12-08 DIAGNOSIS — M25561 Pain in right knee: Secondary | ICD-10-CM

## 2015-12-08 DIAGNOSIS — R262 Difficulty in walking, not elsewhere classified: Secondary | ICD-10-CM

## 2015-12-08 DIAGNOSIS — R600 Localized edema: Secondary | ICD-10-CM
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
## 2015-12-12 ENCOUNTER — Ambulatory Visit: Payer: Medicaid Other | Admitting: Physical Therapy

## 2015-12-12 DIAGNOSIS — R600 Localized edema: Secondary | ICD-10-CM

## 2015-12-12 DIAGNOSIS — R262 Difficulty in walking, not elsewhere classified: Secondary | ICD-10-CM

## 2015-12-12 DIAGNOSIS — M25661 Stiffness of right knee, not elsewhere classified: Secondary | ICD-10-CM

## 2015-12-12 DIAGNOSIS — M25561 Pain in right knee: Secondary | ICD-10-CM
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
## 2015-12-14 ENCOUNTER — Ambulatory Visit: Payer: Medicaid Other | Attending: Orthopedic Surgery | Admitting: Physical Therapy

## 2015-12-14 DIAGNOSIS — M25661 Stiffness of right knee, not elsewhere classified: Secondary | ICD-10-CM | POA: Insufficient documentation

## 2015-12-14 DIAGNOSIS — M25561 Pain in right knee: Secondary | ICD-10-CM | POA: Insufficient documentation

## 2015-12-14 DIAGNOSIS — R262 Difficulty in walking, not elsewhere classified: Secondary | ICD-10-CM | POA: Diagnosis present

## 2015-12-14 DIAGNOSIS — R600 Localized edema: Secondary | ICD-10-CM | POA: Diagnosis present
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
## 2015-12-22 ENCOUNTER — Ambulatory Visit: Payer: Medicaid Other | Admitting: Physical Therapy

## 2015-12-25 ENCOUNTER — Ambulatory Visit: Payer: Medicaid Other | Admitting: Physical Therapy

## 2015-12-25 DIAGNOSIS — M25661 Stiffness of right knee, not elsewhere classified: Secondary | ICD-10-CM | POA: Diagnosis not present

## 2015-12-25 DIAGNOSIS — M25561 Pain in right knee: Secondary | ICD-10-CM

## 2015-12-25 DIAGNOSIS — R262 Difficulty in walking, not elsewhere classified: Secondary | ICD-10-CM

## 2015-12-25 DIAGNOSIS — R600 Localized edema: Secondary | ICD-10-CM
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
## 2015-12-27 ENCOUNTER — Ambulatory Visit: Payer: Medicaid Other | Admitting: Physical Therapy

## 2015-12-29 ENCOUNTER — Encounter: Payer: Medicaid Other | Admitting: Physical Therapy

## 2016-01-01 ENCOUNTER — Ambulatory Visit: Payer: Medicaid Other | Admitting: Physical Therapy

## 2016-01-01 DIAGNOSIS — R600 Localized edema: Secondary | ICD-10-CM

## 2016-01-01 DIAGNOSIS — M25661 Stiffness of right knee, not elsewhere classified: Secondary | ICD-10-CM | POA: Diagnosis not present

## 2016-01-01 DIAGNOSIS — R262 Difficulty in walking, not elsewhere classified: Secondary | ICD-10-CM

## 2016-01-01 DIAGNOSIS — M25561 Pain in right knee: Secondary | ICD-10-CM
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
## 2016-01-03 ENCOUNTER — Ambulatory Visit: Payer: Medicaid Other | Admitting: Physical Therapy

## 2016-01-05 ENCOUNTER — Ambulatory Visit: Payer: Medicaid Other | Admitting: Physical Therapy

## 2016-01-05 DIAGNOSIS — M25561 Pain in right knee: Secondary | ICD-10-CM

## 2016-01-05 DIAGNOSIS — R600 Localized edema: Secondary | ICD-10-CM

## 2016-01-05 DIAGNOSIS — M25661 Stiffness of right knee, not elsewhere classified: Secondary | ICD-10-CM | POA: Diagnosis not present

## 2016-01-05 DIAGNOSIS — R262 Difficulty in walking, not elsewhere classified: Secondary | ICD-10-CM
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
## 2016-01-08 ENCOUNTER — Ambulatory Visit: Payer: Medicaid Other | Admitting: Physical Therapy

## 2016-01-08 DIAGNOSIS — M25661 Stiffness of right knee, not elsewhere classified: Secondary | ICD-10-CM | POA: Diagnosis not present

## 2016-01-08 DIAGNOSIS — R600 Localized edema: Secondary | ICD-10-CM

## 2016-01-08 DIAGNOSIS — R262 Difficulty in walking, not elsewhere classified: Secondary | ICD-10-CM

## 2016-01-08 DIAGNOSIS — M25561 Pain in right knee: Secondary | ICD-10-CM
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
## 2016-01-10 ENCOUNTER — Ambulatory Visit: Payer: Medicaid Other | Admitting: Physical Therapy

## 2016-01-10 DIAGNOSIS — R600 Localized edema: Secondary | ICD-10-CM

## 2016-01-10 DIAGNOSIS — M25661 Stiffness of right knee, not elsewhere classified: Secondary | ICD-10-CM

## 2016-01-10 DIAGNOSIS — M25561 Pain in right knee: Secondary | ICD-10-CM

## 2016-01-10 DIAGNOSIS — R262 Difficulty in walking, not elsewhere classified: Secondary | ICD-10-CM
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
## 2016-01-12 ENCOUNTER — Encounter: Payer: Medicaid Other | Admitting: Physical Therapy

## 2016-01-12 ENCOUNTER — Ambulatory Visit: Payer: Medicaid Other | Admitting: Physical Therapy

## 2016-01-12 DIAGNOSIS — M25661 Stiffness of right knee, not elsewhere classified: Secondary | ICD-10-CM | POA: Diagnosis not present

## 2016-01-12 DIAGNOSIS — M25561 Pain in right knee: Secondary | ICD-10-CM

## 2016-01-12 DIAGNOSIS — R262 Difficulty in walking, not elsewhere classified: Secondary | ICD-10-CM

## 2016-01-12 DIAGNOSIS — R600 Localized edema: Secondary | ICD-10-CM
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
## 2016-01-17 ENCOUNTER — Ambulatory Visit: Payer: Medicaid Other | Attending: Orthopedic Surgery | Admitting: Physical Therapy

## 2016-01-17 DIAGNOSIS — R600 Localized edema: Secondary | ICD-10-CM | POA: Insufficient documentation

## 2016-01-17 DIAGNOSIS — M25661 Stiffness of right knee, not elsewhere classified: Secondary | ICD-10-CM | POA: Insufficient documentation

## 2016-01-17 DIAGNOSIS — R262 Difficulty in walking, not elsewhere classified: Secondary | ICD-10-CM | POA: Insufficient documentation

## 2016-01-17 DIAGNOSIS — M25561 Pain in right knee: Secondary | ICD-10-CM | POA: Insufficient documentation

## 2016-01-19 ENCOUNTER — Ambulatory Visit: Payer: Medicaid Other | Admitting: Physical Therapy

## 2016-01-19 DIAGNOSIS — M25561 Pain in right knee: Secondary | ICD-10-CM | POA: Diagnosis present

## 2016-01-19 DIAGNOSIS — M25661 Stiffness of right knee, not elsewhere classified: Secondary | ICD-10-CM | POA: Diagnosis present

## 2016-01-19 DIAGNOSIS — R600 Localized edema: Secondary | ICD-10-CM

## 2016-01-19 DIAGNOSIS — R262 Difficulty in walking, not elsewhere classified: Secondary | ICD-10-CM
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
## 2016-01-22 ENCOUNTER — Ambulatory Visit: Payer: Medicaid Other | Admitting: Physical Therapy

## 2016-01-22 DIAGNOSIS — R262 Difficulty in walking, not elsewhere classified: Secondary | ICD-10-CM

## 2016-01-22 DIAGNOSIS — M25561 Pain in right knee: Secondary | ICD-10-CM

## 2016-01-22 DIAGNOSIS — M25661 Stiffness of right knee, not elsewhere classified: Secondary | ICD-10-CM

## 2016-01-22 DIAGNOSIS — R600 Localized edema: Secondary | ICD-10-CM
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
## 2016-01-24 ENCOUNTER — Ambulatory Visit: Payer: Medicaid Other | Admitting: Physical Therapy

## 2016-01-24 DIAGNOSIS — R262 Difficulty in walking, not elsewhere classified: Secondary | ICD-10-CM

## 2016-01-24 DIAGNOSIS — M25561 Pain in right knee: Secondary | ICD-10-CM

## 2016-01-24 DIAGNOSIS — M25661 Stiffness of right knee, not elsewhere classified: Secondary | ICD-10-CM

## 2016-01-24 DIAGNOSIS — R600 Localized edema: Secondary | ICD-10-CM
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
## 2016-06-12 ENCOUNTER — Encounter: Payer: Self-pay | Admitting: Podiatry

## 2016-06-12 ENCOUNTER — Ambulatory Visit (INDEPENDENT_AMBULATORY_CARE_PROVIDER_SITE_OTHER): Payer: Medicaid Other

## 2016-06-12 ENCOUNTER — Ambulatory Visit (INDEPENDENT_AMBULATORY_CARE_PROVIDER_SITE_OTHER): Payer: Medicaid Other | Admitting: Podiatry

## 2016-06-12 VITALS — BP 137/84 | HR 87

## 2016-06-12 DIAGNOSIS — M2578 Osteophyte, vertebrae: Secondary | ICD-10-CM

## 2016-06-12 DIAGNOSIS — M79671 Pain in right foot: Secondary | ICD-10-CM

## 2016-06-12 DIAGNOSIS — R600 Localized edema: Secondary | ICD-10-CM

## 2016-06-12 DIAGNOSIS — M2580 Other specified joint disorders, unspecified joint: Secondary | ICD-10-CM

## 2016-06-12 DIAGNOSIS — M79672 Pain in left foot: Secondary | ICD-10-CM

## 2016-06-12 NOTE — Progress Notes (Signed)
   Subjective:    Patient ID: Wesley Prince, male    DOB: 1960/11/07, 55 y.o.   MRN: [id number]  HPI    Review of Systems  Constitutional: Positive for activity change.  Cardiovascular: Positive for leg swelling.  Musculoskeletal: Positive for gait problem.  Hematological: Bruises/bleeds easily.  All other systems reviewed and are negative.      Objective:   Physical Exam        Assessment & Plan:
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
## 2016-06-17 ENCOUNTER — Telehealth: Payer: Self-pay | Admitting: *Deleted

## 2016-06-17 DIAGNOSIS — R600 Localized edema: Secondary | ICD-10-CM

## 2016-06-17 DIAGNOSIS — M79672 Pain in left foot: Secondary | ICD-10-CM

## 2016-06-17 DIAGNOSIS — M2580 Other specified joint disorders, unspecified joint: Secondary | ICD-10-CM

## 2016-06-17 NOTE — Telephone Encounter (Addendum)
-----   Message from Edrick Kins, DPM sent at 06/16/2016  5:18 PM EST ----- Regarding: MRI left foot Please arrange MRI left foot at [facility].  Dx : possible sesamoid fracture left foot. Edema plantar 1st mpj left foot. [employer] requires clinical information to pre-cert for MRI of Left foot. [employer] face sheet, orders, clinicals and demographics

## 2016-06-25 ENCOUNTER — Ambulatory Visit
Admission: RE | Admit: 2016-06-25 | Discharge: 2016-06-25 | Disposition: A | Discharge status: Home / Self Care | Payer: Medicaid Other | Source: Ambulatory Visit | Attending: Podiatry | Admitting: Podiatry

## 2016-06-26 ENCOUNTER — Ambulatory Visit (INDEPENDENT_AMBULATORY_CARE_PROVIDER_SITE_OTHER): Payer: Medicaid Other | Admitting: Podiatry

## 2016-06-26 DIAGNOSIS — M2580 Other specified joint disorders, unspecified joint: Secondary | ICD-10-CM

## 2016-06-26 DIAGNOSIS — M2578 Osteophyte, vertebrae: Secondary | ICD-10-CM | POA: Diagnosis not present

## 2016-06-26 DIAGNOSIS — R600 Localized edema: Secondary | ICD-10-CM

## 2016-06-26 DIAGNOSIS — M79672 Pain in left foot: Secondary | ICD-10-CM

## 2016-06-26 MED ORDER — BETAMETHASONE SOD PHOS & ACET 6 (3-3) MG/ML IJ SUSP: 3.0000 mg | Freq: Once | INTRAMUSCULAR | Status: AC
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
## 2016-07-22 ENCOUNTER — Ambulatory Visit (INDEPENDENT_AMBULATORY_CARE_PROVIDER_SITE_OTHER): Payer: Medicaid Other | Admitting: Podiatry

## 2016-07-22 DIAGNOSIS — M71372 Other bursal cyst, left ankle and foot: Secondary | ICD-10-CM | POA: Diagnosis not present

## 2016-07-22 DIAGNOSIS — M79672 Pain in left foot: Secondary | ICD-10-CM

## 2016-07-22 NOTE — Progress Notes (Signed)
Subjective:  Patient presents today for follow-up evaluation of left foot pain and swelling 3 months. Patient states that he is been off the foot for the past week and his symptoms have subsided somewhat. Patient had a recent MRI of the left foot. Patient presents today to review MRI results and discuss further treatment.    Objective/Physical Exam General: The patient is alert and oriented x3 in no acute distress.  Dermatology: Skin is warm, dry and supple bilateral lower extremities. Negative for open lesions or macerations.  Vascular: Palpable pedal pulses bilaterally. No edema or erythema noted. Capillary refill within normal limits.  Neurological: Epicritic and protective threshold grossly intact bilaterally.   Musculoskeletal Exam: Large palpable fluctuant mass noted to the plantar aspect of the first MPJ left foot. Pain on palpation is also noted. Range of motion within normal limits to all pedal and ankle joints bilateral. Muscle strength 5/5 in all groups bilateral.   MRI results:  FINDINGS: There is a [phone] cm inhomogenous soft tissue mass at the plantar aspect of the flexor tendon of the left great toe at the level of the first metatarsal phalangeal joint. Margins are well defined.  IMPRESSION: Findings consistent with adventitial bursitis at the plantar aspect of the first MPT joint.   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  #2 pain in left foot  Plan of Care:  #1 Patient was evaluated. #2 today we discussed all treatment modalities both conservative and surgical. Patient consents for surgical correction. All details and possible complications of the procedure were explained. All patient questions were answered. No guarantees were expressed or implied. #3 surgery will consist of excision of soft tissue mass first MPJ  left foot. #4 authorization for surgery initiated today #5 return to clinic 1 week postop   Edrick Kins, DPM [facility]  Dr. Edrick Kins, [address]                                        [place], [place] [address]                Office [phone]  Fax [phone]

## 2016-07-22 NOTE — Patient Instructions (Signed)

## 2016-08-07 ENCOUNTER — Encounter: Payer: Medicaid Other | Admitting: Podiatry

## 2016-08-07 ENCOUNTER — Telehealth: Payer: Self-pay | Admitting: *Deleted

## 2016-08-07 NOTE — Telephone Encounter (Signed)
I am calling to see if you would like to reschedule your surgery.  "Yes, I would."  Do you have a date in mind?  "I'd like to do it next week."  He does not have anything available for next week but he can do it on 08/22/2016.  "Okay, that's fine.  What time will it be?"  Someone from the surgical center will call you a day or two prior to surgery date with an arrival time.

## 2016-08-14 ENCOUNTER — Encounter: Payer: Medicaid Other | Admitting: Podiatry

## 2016-08-22 ENCOUNTER — Telehealth: Payer: Self-pay | Admitting: *Deleted

## 2016-08-22 ENCOUNTER — Encounter: Payer: Self-pay | Admitting: Podiatry

## 2016-08-22 DIAGNOSIS — D492 Neoplasm of unspecified behavior of bone, soft tissue, and skin: Secondary | ICD-10-CM | POA: Diagnosis not present

## 2016-08-22 NOTE — Telephone Encounter (Signed)
"  I had surgery by Dr. Amalia Hailey this morning.  He wrote me a prescription.  My pharmacy told me they could not fill it.  They said they need Dr. Amalia Hailey NPI number.  Is that something you can give me?"  No, please have your pharmacy call me.  "I don't know why they are having me do this.  They told me to look on the back of my Medicaid card and call that number."  Does the prescription need authorization?  "I am not sure what they mean.  I am doped up and I am really not sure.  So you are saying I can have the pharmacist call you?"  Yes, you can have them call me.

## 2016-08-28 ENCOUNTER — Ambulatory Visit (INDEPENDENT_AMBULATORY_CARE_PROVIDER_SITE_OTHER): Payer: Self-pay | Admitting: Podiatry

## 2016-08-28 DIAGNOSIS — D492 Neoplasm of unspecified behavior of bone, soft tissue, and skin: Secondary | ICD-10-CM

## 2016-08-28 DIAGNOSIS — Z9889 Other specified postprocedural states: Secondary | ICD-10-CM

## 2016-08-28 NOTE — Progress Notes (Signed)
   subjective: Patient presents today status post excision of soft tissue tumor left foot. Date of surgery 08/22/2016. Patient states that he has minimal pain. Patient states that he is doing well.   Objective: Skin incision is well coapted with sutures intact. No sign of infectious process noted. There is minimal edema noted localized around the surgical incision site plantar to the first MPJ.   Assessment: Status post excision of soft tissue mass first MPJ left foot. Doing well. Date of surgery 08/22/2016.  Plan of care: Today dressings were changed. Return to clinic in 2 weeks for suture removal. Keep dressings clean dry and intact until next visit.

## 2016-08-30 ENCOUNTER — Ambulatory Visit (INDEPENDENT_AMBULATORY_CARE_PROVIDER_SITE_OTHER): Payer: Medicaid Other | Admitting: Podiatry

## 2016-08-30 ENCOUNTER — Encounter: Payer: Self-pay | Admitting: Podiatry

## 2016-08-30 DIAGNOSIS — Z9889 Other specified postprocedural states: Secondary | ICD-10-CM

## 2016-09-02 NOTE — Progress Notes (Signed)
Subjective: Wesley Prince is a 56 y.o. is seen today in office s/p left foot soft tissue mass excision preformed on 08/22/16 with Dr. Amalia Hailey. They state their pain is controlled. He presents today for an unscheduled appointment due to concerns of possibly putting too much pressure to the foot and he noticed some bleeding is concerned the stitch came out. Denies any swelling or trauma. His remaining surgical shoe. Denies any systemic complaints such as fevers, chills, nausea, vomiting. No calf pain, chest pain, shortness of breath.   Objective: General: No acute distress, AAOx3  DP/PT pulses palpable 2/4, CRT < 3 sec to all digits.  Protective sensation intact. Motor function intact.  Right foot: Incision is well coapted without any evidence of dehiscence and sutures are intact. There is one area of the suture is loose but the incision overall is well coapted. There is no surrounding erythema, ascending cellulitis, fluctuance, crepitus, malodor, drainage/purulence. There is minimal edema around the surgical site. There is no pain along the surgical site.  No other areas of tenderness to bilateral lower extremities.  No other open lesions or pre-ulcerative lesions.  No pain with calf compression, swelling, warmth, erythema.   Assessment and Plan:  Status post left foot soft tissue mass excision, presents today for concerns of a popped stich  -Treatment options discussed including all alternatives, risks, and complications -Incision is healing well there is no evidence of dehiscence. There is no active bleeding present. Antibiotic ointment was applied followed by a bandage. Keep bandage clean, dry, intact. -Continue surgical shoe. -Ice/elevation -Pain medication as needed. -Monitor for any clinical signs or symptoms of infection and DVT/PE and directed to call the office immediately should any occur or go to the ER. -Follow-up as scheduled or sooner if any problems arise. In the meantime, encouraged  to call the office with any questions, concerns, change in symptoms.   Celesta Gentile, DPM

## 2016-09-04 ENCOUNTER — Encounter: Payer: Medicaid Other | Admitting: Podiatry

## 2016-09-09 ENCOUNTER — Ambulatory Visit (INDEPENDENT_AMBULATORY_CARE_PROVIDER_SITE_OTHER): Payer: Self-pay | Admitting: Podiatry

## 2016-09-09 ENCOUNTER — Encounter: Payer: Self-pay | Admitting: Podiatry

## 2016-09-09 VITALS — BP 134/94 | HR 72

## 2016-09-09 DIAGNOSIS — Z9889 Other specified postprocedural states: Secondary | ICD-10-CM

## 2016-09-09 DIAGNOSIS — D492 Neoplasm of unspecified behavior of bone, soft tissue, and skin: Secondary | ICD-10-CM

## 2016-09-11 NOTE — Progress Notes (Signed)
DOS 02.08.2018 Excision of soft tissue mass left foot. Any other indicated procedure.

## 2016-09-15 NOTE — Progress Notes (Signed)
   subjective: Patient presents today status post excision of soft tissue tumor left foot. Date of surgery 08/22/2016. Patient states that he has minimal pain. Patient states that he is doing well.   Objective: Skin incision is well coapted with sutures intact. No sign of infectious process noted. There is minimal edema noted localized around the surgical incision site plantar to the first MPJ.   Assessment: Status post excision of soft tissue mass first MPJ left foot. Doing well. Date of surgery 08/22/2016.  Plan of care: Today dressings were changed. Return to clinic in 1 week for suture removal. Today we decided to leave the sutures intact to ensure there is no dehiscence upon suture removal. Patient denies any pain today. Minimal bleeding.

## 2016-09-16 ENCOUNTER — Encounter: Payer: Self-pay | Admitting: Podiatry

## 2016-09-16 ENCOUNTER — Ambulatory Visit (INDEPENDENT_AMBULATORY_CARE_PROVIDER_SITE_OTHER): Payer: Self-pay | Admitting: Podiatry

## 2016-09-16 DIAGNOSIS — D492 Neoplasm of unspecified behavior of bone, soft tissue, and skin: Secondary | ICD-10-CM

## 2016-09-16 DIAGNOSIS — Z9889 Other specified postprocedural states: Secondary | ICD-10-CM

## 2016-09-16 DIAGNOSIS — M79672 Pain in left foot: Secondary | ICD-10-CM

## 2016-09-16 NOTE — Progress Notes (Signed)
   subjective: Patient presents today status post excision of soft tissue tumor left foot. Date of surgery 08/22/2016. Patient states that he has minimal pain. Patient states that he is doing well.   Objective: Skin incision is well coapted with sutures intact. No sign of infectious process noted. There is minimal edema noted localized around the surgical incision site plantar to the first MPJ. Xerosis dry skin noted throughout the left foot.  Assessment: Status post excision of soft tissue mass first MPJ left foot. Doing well. Date of surgery 08/22/2016.  Plan of care: Today sutures were removed. Patient can begin showering in getting the foot wet. Slowly transition from a postoperative shoe to good supportive tennis shoes. Return to clinic in 4 weeks.

## 2016-09-17 ENCOUNTER — Encounter: Payer: Self-pay | Admitting: Podiatry

## 2016-09-30 ENCOUNTER — Telehealth: Payer: Self-pay | Admitting: *Deleted

## 2016-09-30 NOTE — Telephone Encounter (Signed)
Pt states he needs an appt as soon as possible.

## 2016-10-02 ENCOUNTER — Ambulatory Visit: Payer: Medicaid Other

## 2016-10-02 ENCOUNTER — Ambulatory Visit (INDEPENDENT_AMBULATORY_CARE_PROVIDER_SITE_OTHER): Payer: Self-pay | Admitting: Podiatry

## 2016-10-02 ENCOUNTER — Encounter: Payer: Self-pay | Admitting: Podiatry

## 2016-10-02 DIAGNOSIS — Z9889 Other specified postprocedural states: Secondary | ICD-10-CM

## 2016-10-02 DIAGNOSIS — M779 Enthesopathy, unspecified: Secondary | ICD-10-CM

## 2016-10-09 NOTE — Progress Notes (Signed)
   subjective: Patient presents today status post excision of soft tissue adventitious bursa to the plantar aspect of the first MPJ left foot. Date of surgery 08/22/2016. Patient states that he has minimal pain. Patient states that he is doing well.   Objective: Skin incision is well coapted. No sign of infectious process noted. There is a significant amount of callous tissue noted surrounding the incision site today.  Assessment: Status post excision of soft tissue mass first MPJ left foot. Doing well. Date of surgery 08/22/2016.  Plan of care: Patient was evaluated today. Excisional debridement of. Incisional callus tissue was performed using a tissue nipper and chisel blade without incident. Today the patient is healed. Return to clinic when necessary

## 2016-10-21 ENCOUNTER — Ambulatory Visit: Payer: Medicaid Other | Admitting: Podiatry

## 2017-05-05 ENCOUNTER — Encounter (HOSPITAL_COMMUNITY): Payer: Self-pay

## 2017-05-05 ENCOUNTER — Emergency Department (HOSPITAL_COMMUNITY): Payer: Self-pay

## 2017-05-05 ENCOUNTER — Emergency Department (HOSPITAL_COMMUNITY)
Admission: EM | Admit: 2017-05-05 | Discharge: 2017-05-05 | Disposition: A | Discharge status: Home / Self Care | Payer: Self-pay | Attending: Emergency Medicine | Admitting: Emergency Medicine

## 2017-05-05 DIAGNOSIS — M545 Low back pain, unspecified: Secondary | ICD-10-CM

## 2017-05-05 DIAGNOSIS — Z79899 Other long term (current) drug therapy: Secondary | ICD-10-CM | POA: Insufficient documentation

## 2017-05-05 DIAGNOSIS — F17210 Nicotine dependence, cigarettes, uncomplicated: Secondary | ICD-10-CM | POA: Insufficient documentation

## 2017-05-05 LAB — BASIC METABOLIC PANEL
ANION GAP: 7 (ref 5–15)
BUN: 15 mg/dL (ref 6–20)
CALCIUM: 9 mg/dL (ref 8.9–10.3)
CO2: 25 mmol/L (ref 22–32)
Chloride: 111 mmol/L (ref 101–111)
Creatinine, Ser: 0.96 mg/dL (ref 0.61–1.24)
GFR calc Af Amer: >60 mL/min (ref >60)
GFR calc non Af Amer: >60 mL/min (ref >60)
GLUCOSE: 125 mg/dL — AB (ref 65–99)
Potassium: 4.2 mmol/L (ref 3.5–5.1)
Sodium: 143 mmol/L (ref 135–145)

## 2017-05-05 LAB — URINALYSIS, ROUTINE W REFLEX MICROSCOPIC
Bacteria, UA: NONE SEEN
Bilirubin Urine: NEGATIVE
GLUCOSE, UA: NEGATIVE mg/dL
KETONES UR: NEGATIVE mg/dL
Leukocytes, UA: NEGATIVE
NITRITE: NEGATIVE
PH: 5 (ref 5.0–8.0)
Protein, ur: NEGATIVE mg/dL
Specific Gravity, Urine: 1.019 (ref 1.005–1.030)
Squamous Epithelial / LPF: NONE SEEN

## 2017-05-05 LAB — CBC
HCT: 40.3 % (ref 39.0–52.0)
HEMOGLOBIN: 13.5 g/dL (ref 13.0–17.0)
MCH: 30.1 pg (ref 26.0–34.0)
MCHC: 33.5 g/dL (ref 30.0–36.0)
MCV: 90 fL (ref 78.0–100.0)
Platelets: 197 10*3/uL (ref 150–400)
RBC: 4.48 MIL/uL (ref 4.22–5.81)
RDW: 13.6 % (ref 11.5–15.5)
WBC: 6.7 10*3/uL (ref 4.0–10.5)

## 2017-05-05 MED ORDER — METHOCARBAMOL 500 MG PO TABS: 500.0000 mg | ORAL_TABLET | Freq: Two times a day (BID) | ORAL | 0 refills | Status: DC

## 2017-05-05 MED ORDER — HYDROMORPHONE HCL 1 MG/ML IJ SOLN
1.0000 mg | Freq: Once | INTRAMUSCULAR | Status: AC
  Administered 2017-05-05: 1 mg via INTRAVENOUS
  Filled 2017-05-05: qty 1

## 2017-05-05 MED ORDER — NAPROXEN 500 MG PO TABS: 500.0000 mg | ORAL_TABLET | Freq: Two times a day (BID) | ORAL | 0 refills | Status: DC

## 2017-05-05 NOTE — ED Triage Notes (Signed)
States flank pain bil since 2230 pm hx of kidney stones no dysuria voiced. No fever voiced.
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
## 2017-05-05 NOTE — Discharge Instructions (Signed)
Evaluation today is reassuring, labs are unremarkable and CT scan shows no evidence of kidney stones, no worrisome bone abnormalities. You may take naproxen and robaxin for pain. Please schedule appointment to follow-up with your primary doctor once you return to [place] this week. If you develop fevers or chills, worsening back pain with loss of bowel or bladder control, weakness or numbness nebulizer having trouble walking please return for sooner evaluation.

## 2017-05-05 NOTE — ED Notes (Signed)
Patient transported to CT 
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
## 2019-11-22 ENCOUNTER — Emergency Department (HOSPITAL_COMMUNITY)
Admission: EM | Admit: 2019-11-22 | Discharge: 2019-11-22 | Disposition: A | Discharge status: Home / Self Care | Payer: Medicare HMO | Attending: Emergency Medicine | Admitting: Emergency Medicine

## 2019-11-22 ENCOUNTER — Encounter (HOSPITAL_COMMUNITY): Payer: Self-pay | Admitting: Emergency Medicine

## 2019-11-22 DIAGNOSIS — Y92008 Other place in unspecified non-institutional (private) residence as the place of occurrence of the external cause: Secondary | ICD-10-CM | POA: Insufficient documentation

## 2019-11-22 DIAGNOSIS — Z79899 Other long term (current) drug therapy: Secondary | ICD-10-CM | POA: Diagnosis not present

## 2019-11-22 DIAGNOSIS — R519 Headache, unspecified: Secondary | ICD-10-CM | POA: Insufficient documentation

## 2019-11-22 DIAGNOSIS — S59912A Unspecified injury of left forearm, initial encounter: Secondary | ICD-10-CM | POA: Diagnosis present

## 2019-11-22 DIAGNOSIS — T22212A Burn of second degree of left forearm, initial encounter: Secondary | ICD-10-CM | POA: Insufficient documentation

## 2019-11-22 DIAGNOSIS — Y999 Unspecified external cause status: Secondary | ICD-10-CM | POA: Insufficient documentation

## 2019-11-22 DIAGNOSIS — X088XXA Exposure to other specified smoke, fire and flames, initial encounter: Secondary | ICD-10-CM | POA: Diagnosis not present

## 2019-11-22 DIAGNOSIS — T2010XA Burn of first degree of head, face, and neck, unspecified site, initial encounter: Secondary | ICD-10-CM | POA: Insufficient documentation

## 2019-11-22 DIAGNOSIS — F17210 Nicotine dependence, cigarettes, uncomplicated: Secondary | ICD-10-CM | POA: Diagnosis not present

## 2019-11-22 DIAGNOSIS — T300 Burn of unspecified body region, unspecified degree: Secondary | ICD-10-CM

## 2019-11-22 DIAGNOSIS — Y9389 Activity, other specified: Secondary | ICD-10-CM | POA: Insufficient documentation

## 2019-11-22 DIAGNOSIS — T2015XA Burn of first degree of scalp [any part], initial encounter: Secondary | ICD-10-CM | POA: Diagnosis not present

## 2019-11-22 MED ORDER — OXYCODONE-ACETAMINOPHEN 5-325 MG PO TABS: 1.0000 | ORAL_TABLET | ORAL | 0 refills | Status: DC | PRN

## 2019-11-22 MED ORDER — OXYCODONE-ACETAMINOPHEN 5-325 MG PO TABS
2.0000 | ORAL_TABLET | Freq: Once | ORAL | Status: AC
  Administered 2019-11-22: 13:00:00 2 via ORAL
  Filled 2019-11-22: qty 2

## 2019-11-22 MED ORDER — BACITRACIN-NEOMYCIN-POLYMYXIN OINTMENT TUBE
TOPICAL_OINTMENT | Freq: Once | CUTANEOUS | Status: AC
  Administered 2019-11-22: 1 via TOPICAL
  Filled 2019-11-22: qty 14

## 2019-11-22 MED ORDER — SILVER SULFADIAZINE 1 % EX CREA
TOPICAL_CREAM | Freq: Once | CUTANEOUS | Status: AC
  Administered 2019-11-22: 1 via TOPICAL
  Filled 2019-11-22: qty 85

## 2019-11-22 NOTE — Discharge Instructions (Addendum)
Recheck at Urgent care in 2 days
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
## 2019-12-10 ENCOUNTER — Encounter (HOSPITAL_BASED_OUTPATIENT_CLINIC_OR_DEPARTMENT_OTHER): Payer: Medicare HMO | Admitting: Internal Medicine

## 2020-01-05 ENCOUNTER — Emergency Department (HOSPITAL_COMMUNITY)
Admission: EM | Admit: 2020-01-05 | Discharge: 2020-01-05 | Disposition: A | Discharge status: Home / Self Care | Payer: Medicare HMO | Attending: Emergency Medicine | Admitting: Emergency Medicine

## 2020-01-05 ENCOUNTER — Other Ambulatory Visit: Payer: Self-pay

## 2020-01-05 ENCOUNTER — Encounter (HOSPITAL_COMMUNITY): Payer: Self-pay | Admitting: Emergency Medicine

## 2020-01-05 DIAGNOSIS — Z5321 Procedure and treatment not carried out due to patient leaving prior to being seen by health care provider: Secondary | ICD-10-CM | POA: Diagnosis not present

## 2020-01-05 DIAGNOSIS — M545 Low back pain: Secondary | ICD-10-CM | POA: Diagnosis not present

## 2020-01-05 NOTE — ED Triage Notes (Signed)
Patient here from home reporting lower back pain that is worse for the past 2 days. Muscle relaxants and ibuprofen with no relief.

## 2020-01-31 ENCOUNTER — Ambulatory Visit: Payer: Medicare HMO | Attending: Critical Care Medicine

## 2020-01-31 DIAGNOSIS — Z23 Encounter for immunization: Secondary | ICD-10-CM

## 2020-01-31 NOTE — Progress Notes (Signed)
   Covid-19 Vaccination Clinic  Name:  Wesley Prince    MRN: [id number] DOB: 1960/11/08  01/31/2020  Wesley Prince was observed post Covid-19 immunization for 15 minutes without incident. He was provided with Vaccine Information Sheet and instruction to access the V-Safe system.   Wesley Prince was instructed to call 911 with any severe reactions post vaccine: Marland Kitchen Difficulty breathing  . Swelling of face and throat  . A fast heartbeat  . A bad rash all over body  . Dizziness and weakness   Immunizations Administered    Name Date Dose VIS Date Route   Pfizer COVID-19 Vaccine 01/31/2020  1:44 PM 0.3 mL 09/08/2018 Intramuscular   Manufacturer: [employer]   Lot: [id number]   [place]: [id number]

## 2020-03-13 ENCOUNTER — Other Ambulatory Visit: Payer: Self-pay

## 2020-03-13 ENCOUNTER — Ambulatory Visit (INDEPENDENT_AMBULATORY_CARE_PROVIDER_SITE_OTHER): Payer: Medicare HMO | Admitting: Podiatry

## 2020-03-13 ENCOUNTER — Encounter: Payer: Self-pay | Admitting: Podiatry

## 2020-03-13 ENCOUNTER — Ambulatory Visit (INDEPENDENT_AMBULATORY_CARE_PROVIDER_SITE_OTHER): Payer: Medicare HMO

## 2020-03-13 VITALS — BP 109/76 | HR 72 | Temp 96.2°F | Resp 14

## 2020-03-13 DIAGNOSIS — M2580 Other specified joint disorders, unspecified joint: Secondary | ICD-10-CM

## 2020-03-13 DIAGNOSIS — M79671 Pain in right foot: Secondary | ICD-10-CM

## 2020-03-13 DIAGNOSIS — G5763 Lesion of plantar nerve, bilateral lower limbs: Secondary | ICD-10-CM | POA: Diagnosis not present

## 2020-03-13 DIAGNOSIS — M79672 Pain in left foot: Secondary | ICD-10-CM | POA: Diagnosis not present

## 2020-03-13 DIAGNOSIS — M779 Enthesopathy, unspecified: Secondary | ICD-10-CM

## 2020-03-13 DIAGNOSIS — L84 Corns and callosities: Secondary | ICD-10-CM

## 2020-03-13 DIAGNOSIS — M21619 Bunion of unspecified foot: Secondary | ICD-10-CM | POA: Diagnosis not present

## 2020-03-14 ENCOUNTER — Other Ambulatory Visit: Payer: Self-pay | Admitting: Podiatry

## 2020-03-14 DIAGNOSIS — M779 Enthesopathy, unspecified: Secondary | ICD-10-CM
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
## 2020-04-10 ENCOUNTER — Other Ambulatory Visit: Payer: Self-pay

## 2020-04-10 ENCOUNTER — Ambulatory Visit: Payer: Medicare HMO | Admitting: Orthotics

## 2020-04-10 DIAGNOSIS — M2580 Other specified joint disorders, unspecified joint: Secondary | ICD-10-CM

## 2020-04-10 DIAGNOSIS — M79671 Pain in right foot: Secondary | ICD-10-CM

## 2020-04-10 DIAGNOSIS — M21619 Bunion of unspecified foot: Secondary | ICD-10-CM

## 2020-05-29 NOTE — Progress Notes (Signed)
Patient picked up f/o

## 2020-07-13 ENCOUNTER — Encounter: Payer: Self-pay | Admitting: Gastroenterology

## 2020-08-16 ENCOUNTER — Other Ambulatory Visit: Payer: Self-pay

## 2020-08-16 ENCOUNTER — Ambulatory Visit (AMBULATORY_SURGERY_CENTER): Payer: Self-pay | Admitting: *Deleted

## 2020-08-16 VITALS — Ht 70.0 in | Wt 190.0 lb

## 2020-08-16 DIAGNOSIS — Z1211 Encounter for screening for malignant neoplasm of colon: Secondary | ICD-10-CM

## 2020-08-16 MED ORDER — NA SULFATE-K SULFATE-MG SULF 17.5-3.13-1.6 GM/177ML PO SOLN: 1.0000 | Freq: Once | ORAL | 0 refills | Status: AC

## 2020-08-16 NOTE — Progress Notes (Addendum)

## 2020-08-30 ENCOUNTER — Encounter: Payer: Self-pay | Admitting: Gastroenterology

## 2020-08-30 ENCOUNTER — Other Ambulatory Visit: Payer: Self-pay

## 2020-08-30 ENCOUNTER — Ambulatory Visit (AMBULATORY_SURGERY_CENTER): Payer: Medicare HMO | Admitting: Gastroenterology

## 2020-08-30 VITALS — BP 138/88 | HR 69 | Temp 97.6°F | Resp 18 | Ht 70.0 in | Wt 190.0 lb

## 2020-08-30 DIAGNOSIS — D124 Benign neoplasm of descending colon: Secondary | ICD-10-CM

## 2020-08-30 DIAGNOSIS — D125 Benign neoplasm of sigmoid colon: Secondary | ICD-10-CM | POA: Diagnosis not present

## 2020-08-30 DIAGNOSIS — Z1211 Encounter for screening for malignant neoplasm of colon: Secondary | ICD-10-CM | POA: Diagnosis not present

## 2020-08-30 DIAGNOSIS — D122 Benign neoplasm of ascending colon: Secondary | ICD-10-CM | POA: Diagnosis not present

## 2020-08-30 DIAGNOSIS — K635 Polyp of colon: Secondary | ICD-10-CM | POA: Diagnosis not present

## 2020-08-30 DIAGNOSIS — D123 Benign neoplasm of transverse colon: Secondary | ICD-10-CM

## 2020-08-30 MED ORDER — SODIUM CHLORIDE 0.9 % IV SOLN: 500.0000 mL | Freq: Once | INTRAVENOUS | Status: DC

## 2020-08-30 NOTE — Progress Notes (Addendum)
Ar - check-in  CW - VS  Pt's states no medical or surgical changes since previsit or office visit.  Pt last drank 16 oz water at 14:00.  Reported to Kassie Mends, CRNA.  Need to wait till at least 16:00 before we can do colonoscopy.  Pt and his care partner were both made aware. maw

## 2020-08-30 NOTE — Progress Notes (Unsigned)
Called to room to assist during endoscopic procedure.  Patient ID and intended procedure confirmed with present staff. Received instructions for my participation in the procedure from the performing physician.  

## 2020-08-30 NOTE — Patient Instructions (Signed)
Thank you for allowing [place] to care for you today!  Await results of polyps removed, approximately 7-10 days.  Will make recommendation at that time for your next colonoscopy.  Resume previous diet and medications today.  Resume your normal daily activities tomorrow.    YOU HAD AN ENDOSCOPIC PROCEDURE TODAY AT [facility]:   Refer to the procedure report that was given to you for any specific questions about what was found during the examination.  If the procedure report does not answer your questions, please call your gastroenterologist to clarify.  If you requested that your care partner not be given the details of your procedure findings, then the procedure report has been included in a sealed envelope for you to review at your convenience later.  YOU SHOULD EXPECT: Some feelings of bloating in the abdomen. Passage of more gas than usual.  Walking can help get rid of the air that was put into your GI tract during the procedure and reduce the bloating. If you had a lower endoscopy (such as a colonoscopy or flexible sigmoidoscopy) you may notice spotting of blood in your stool or on the toilet paper. If you underwent a bowel prep for your procedure, you may not have a normal bowel movement for a few days.  Please Note:  You might notice some irritation and congestion in your nose or some drainage.  This is from the oxygen used during your procedure.  There is no need for concern and it should clear up in a day or so.  SYMPTOMS TO REPORT IMMEDIATELY:   Following lower endoscopy (colonoscopy or flexible sigmoidoscopy):  Excessive amounts of blood in the stool  Significant tenderness or worsening of abdominal pains  Swelling of the abdomen that is new, acute  Fever of 100F or higher    For urgent or emergent issues, a gastroenterologist can be reached at any hour by calling [phone]. Do not use MyChart messaging for urgent concerns.    DIET:  We do recommend a small  meal at first, but then you may proceed to your regular diet.  Drink plenty of fluids but you should avoid alcoholic beverages for 24 hours.  ACTIVITY:  You should plan to take it easy for the rest of today and you should NOT DRIVE or use heavy machinery until tomorrow (because of the sedation medicines used during the test).    FOLLOW UP: Our staff will call the number listed on your records 48-72 hours following your procedure to check on you and address any questions or concerns that you may have regarding the information given to you following your procedure. If we do not reach you, we will leave a message.  We will attempt to reach you two times.  During this call, we will ask if you have developed any symptoms of COVID 19. If you develop any symptoms (ie: fever, flu-like symptoms, shortness of breath, cough etc.) before then, please call [phone].  If you test positive for Covid 19 in the 2 weeks post procedure, please call and report this information to [place].    If any biopsies were taken you will be contacted by phone or by letter within the next 1-3 weeks.  Please call us at [phone] if you have not heard about the biopsies in 3 weeks.    SIGNATURES/CONFIDENTIALITY: You and/or your care partner have signed paperwork which will be entered into your electronic medical record.  These signatures attest to the fact that that  the information above on your After Visit Summary has been reviewed and is understood.  Full responsibility of the confidentiality of this discharge information lies with you and/or your care-partner.

## 2020-08-30 NOTE — Op Note (Signed)
[name] Patient Name: Wesley Prince Procedure Date: 08/30/2020 3:34 PM MRN: [id number] Endoscopist: Mallie Mussel L. Loletha Carrow , MD Age: 60 Referring MD:  Date of Birth: 03/22/1961 Gender: Male Account #: [id number] Procedure:                Colonoscopy Indications:              Screening for colorectal malignant neoplasm, This                            is the patient's first colonoscopy Medicines:                Monitored Anesthesia Care Procedure:                Pre-Anesthesia Assessment:                           - Prior to the procedure, a History and Physical                            was performed, and patient medications and                            allergies were reviewed. The patient's tolerance of                            previous anesthesia was also reviewed. The risks                            and benefits of the procedure and the sedation                            options and risks were discussed with the patient.                            All questions were answered, and informed consent                            was obtained. Prior Anticoagulants: The patient has                            taken no previous anticoagulant or antiplatelet                            agents. ASA Grade Assessment: II - A patient with                            mild systemic disease. After reviewing the risks                            and benefits, the patient was deemed in                            satisfactory condition to undergo the procedure.  After obtaining informed consent, the colonoscope                            was passed under direct vision. Throughout the                            procedure, the patient's blood pressure, pulse, and                            oxygen saturations were monitored continuously. The                            Colonoscope was introduced through the anus and                            advanced to the the cecum,  identified by                            appendiceal orifice and ileocecal valve. The                            colonoscopy was performed without difficulty. The                            patient tolerated the procedure well. The quality                            of the bowel preparation was excellent. The                            ileocecal valve, appendiceal orifice, and rectum                            were photographed. The bowel preparation used was                            SUPREP. Scope In: 4:07:36 PM Scope Out: 4:30:46 PM Scope Withdrawal Time: 0 hours 20 minutes 9 seconds  Total Procedure Duration: 0 hours 23 minutes 10 seconds  Findings:                 The perianal and digital rectal examinations were                            normal.                           Four sessile polyps were found in the descending                            colon and transverse colon. The polyps were 5 to 10                            mm in size. These polyps were removed with a cold  snare. Resection and retrieval were complete.                           Three pedunculated and sessile polyps were found in                            the sigmoid colon and descending colon. The polyps                            were 6 to 8 mm in size. These polyps were removed                            with a hot snare. Resection and retrieval were                            complete.                           Two sessile polyps were found in the sigmoid colon.                            The polyps were 4 to 6 mm in size. These polyps                            were removed with a cold snare. Resection and                            retrieval were complete.                           The exam was otherwise without abnormality on                            direct and retroflexion views. Complications:            No immediate complications. Estimated Blood Loss:     Estimated blood loss  was minimal. Impression:               - Four 5 to 10 mm polyps in the descending colon                            and in the transverse colon, removed with a cold                            snare. Resected and retrieved.                           - Three 6 to 8 mm polyps in the sigmoid colon and                            in the descending colon, removed with a hot snare.  Resected and retrieved.                           - Two 4 to 6 mm polyps in the sigmoid colon,                            removed with a cold snare. Resected and retrieved.                           - The examination was otherwise normal on direct                            and retroflexion views. Recommendation:           - Patient has a contact number available for                            emergencies. The signs and symptoms of potential                            delayed complications were discussed with the                            patient. Return to normal activities tomorrow.                            Written discharge instructions were provided to the                            patient.                           - Resume previous diet.                           - Continue present medications.                           - Await pathology results.                           - Repeat colonoscopy is recommended for                            surveillance. The colonoscopy date will be                            determined after pathology results from today's                            exam become available for review. Ruthetta Koopmann L. Loletha Carrow, MD 08/30/2020 4:45:00 PM This report has been signed electronically.

## 2020-08-30 NOTE — Progress Notes (Signed)
PT taken to PACU. Monitors in place. VSS. Report given to RN. 

## 2020-09-01 ENCOUNTER — Telehealth: Payer: Self-pay | Admitting: *Deleted

## 2020-09-01 NOTE — Telephone Encounter (Signed)
  Follow up Call-  Call back number 08/30/2020  Post procedure Call Back phone  # [phone] cell  Permission to leave phone message Yes  Some recent data might be hidden     Patient questions:  Do you have a fever, pain , or abdominal swelling? No. Pain Score  0 *  Have you tolerated food without any problems? Yes.    Have you been able to return to your normal activities? Yes.    Do you have any questions about your discharge instructions: Diet   No. Medications  No. Follow up visit  No.  Do you have questions or concerns about your Care? No.  Actions: * If pain score is 4 or above: 1. No action needed, pain <4.Have you developed a fever since your procedure? no  2.   Have you had an respiratory symptoms (SOB or cough) since your procedure? no  3.   Have you tested positive for COVID 19 since your procedure no  4.   Have you had any family members/close contacts diagnosed with the COVID 19 since your procedure?  no   If yes to any of these questions please route to Joylene John, RN and Joella Prince, RN

## 2020-09-07 ENCOUNTER — Encounter: Payer: Self-pay | Admitting: Gastroenterology

## 2020-12-07 ENCOUNTER — Other Ambulatory Visit: Payer: Self-pay | Admitting: Internal Medicine

## 2020-12-08 LAB — CBC
HCT: 41.6 % (ref 38.5–50.0)
Hemoglobin: 14.1 g/dL (ref 13.2–17.1)
MCH: 31.3 pg (ref 27.0–33.0)
MCHC: 33.9 g/dL (ref 32.0–36.0)
MCV: 92.2 fL (ref 80.0–100.0)
MPV: 9.7 fL (ref 7.5–12.5)
Platelets: 214 10*3/uL (ref 140–400)
RBC: 4.51 10*6/uL (ref 4.20–5.80)
RDW: 13.1 % (ref 11.0–15.0)
WBC: 10.9 10*3/uL — ABNORMAL HIGH (ref 3.8–10.8)

## 2020-12-08 LAB — LIPID PANEL
Cholesterol: 229 mg/dL — ABNORMAL HIGH (ref <200)
HDL: 87 mg/dL (ref >=40)
LDL Cholesterol (Calc): 112 mg/dL (calc) — ABNORMAL HIGH
Non-HDL Cholesterol (Calc): 142 mg/dL (calc) — ABNORMAL HIGH (ref <130)
Total CHOL/HDL Ratio: 2.6 (calc) (ref <5.0)
Triglycerides: 182 mg/dL — ABNORMAL HIGH (ref <150)

## 2020-12-08 LAB — COMPLETE METABOLIC PANEL WITH GFR
AG Ratio: 1.3 (calc) (ref 1.0–2.5)
ALT: 19 U/L (ref 9–46)
AST: 19 U/L (ref 10–35)
Albumin: 4.1 g/dL (ref 3.6–5.1)
Alkaline phosphatase (APISO): 42 U/L (ref 35–144)
BUN: 20 mg/dL (ref 7–25)
CO2: 18 mmol/L — ABNORMAL LOW (ref 20–32)
Calcium: 9.5 mg/dL (ref 8.6–10.3)
Chloride: 105 mmol/L (ref 98–110)
Creat: 0.95 mg/dL (ref 0.70–1.33)
GFR, Est African American: 102 mL/min/{1.73_m2} (ref >=60)
GFR, Est Non African American: 88 mL/min/{1.73_m2} (ref >=60)
Globulin: 3.2 g/dL (calc) (ref 1.9–3.7)
Glucose, Bld: 90 mg/dL (ref 65–99)
Potassium: 4 mmol/L (ref 3.5–5.3)
Sodium: 139 mmol/L (ref 135–146)
Total Bilirubin: 0.4 mg/dL (ref 0.2–1.2)
Total Protein: 7.3 g/dL (ref 6.1–8.1)

## 2020-12-08 LAB — TSH: TSH: 0.27 mIU/L — ABNORMAL LOW (ref 0.40–4.50)

## 2020-12-08 LAB — VITAMIN D 25 HYDROXY (VIT D DEFICIENCY, FRACTURES): Vit D, 25-Hydroxy: 66 ng/mL (ref 30–100)

## 2020-12-08 LAB — URIC ACID: Uric Acid, Serum: 8.8 mg/dL — ABNORMAL HIGH (ref 4.0–8.0)

## 2020-12-08 LAB — PSA: PSA: 0.54 ng/mL (ref <=4.00)

## 2021-04-16 ENCOUNTER — Encounter: Payer: Self-pay | Admitting: Podiatry

## 2021-04-16 ENCOUNTER — Other Ambulatory Visit: Payer: Self-pay

## 2021-04-16 ENCOUNTER — Ambulatory Visit (INDEPENDENT_AMBULATORY_CARE_PROVIDER_SITE_OTHER): Payer: Medicare Other | Admitting: Podiatry

## 2021-04-16 DIAGNOSIS — L84 Corns and callosities: Secondary | ICD-10-CM | POA: Diagnosis not present

## 2021-04-16 DIAGNOSIS — M21619 Bunion of unspecified foot: Secondary | ICD-10-CM

## 2021-04-18 NOTE — Progress Notes (Signed)
Subjective: 60 year old male presents the office today for concerns of calluses to both of his big toes, medial aspect.  He states that they get tender to the right side worse than left.  No swelling redness or any drainage.  He states the orthotics have been life-changing.  He does feel on the right side however that was likely not long enough.  He has no recent injury or changes no other concerns  Objective: AAO x3, NAD DP/PT pulses palpable bilaterally, CRT less than 3 seconds Hyperkeratotic lesions medial aspect of the hallux IPJ bilaterally right side worse than left without any underlying ulceration drainage or any signs of infection.  There are no open lesions.  Dry skin is present with any open sores.  MMT 5/5.  No other areas of discomfort. No pain with calf compression, swelling, warmth, erythema  Assessment: Hyperkeratotic lesions  Plan: -All treatment options discussed with the patient including all alternatives, risks, complications.  -Sharply debrided the hyperkeratotic lesions without any complications or bleeding x2.  Recommended moisturizer and offloading. -I am going to have him follow up with [facility] to see about redoing the top-cover of the orthotic on the right side.  Trula Slade DPM

## 2021-06-22 ENCOUNTER — Ambulatory Visit: Payer: Medicare Other

## 2021-06-22 ENCOUNTER — Other Ambulatory Visit: Payer: Self-pay

## 2021-06-22 DIAGNOSIS — M25879 Other specified joint disorders, unspecified ankle and foot: Secondary | ICD-10-CM

## 2021-06-22 NOTE — Progress Notes (Signed)
SITUATION Reason for Consult: Follow-up with functional foot orthotics Patient / Caregiver Report: Patient reports right foot has uncomfortable sensations on the toes  OBJECTIVE DATA History / Diagnosis: Sesamoiditis of foot, unspecified laterality  Change in Pathology: None  ACTIONS PERFORMED Patient's equipment was checked for structural stability and fit. Trimmed right orthotic to sulcus to prevent toe discomfort. Relaced patient's shoe in ladder lace configuration for stability. Device(s) intact and fit is excellent. All questions answered and concerns addressed.  PLAN Follow-up as needed (PRN). Plan of care discussed with and agreed upon by patient / caregiver.

## 2021-12-03 ENCOUNTER — Other Ambulatory Visit: Payer: Self-pay | Admitting: Internal Medicine

## 2021-12-04 LAB — COMPLETE METABOLIC PANEL WITH GFR
AG Ratio: 1.3 (calc) (ref 1.0–2.5)
ALT: 19 U/L (ref 9–46)
AST: 15 U/L (ref 10–35)
Albumin: 4.1 g/dL (ref 3.6–5.1)
Alkaline phosphatase (APISO): 44 U/L (ref 35–144)
BUN: 15 mg/dL (ref 7–25)
CO2: 24 mmol/L (ref 20–32)
Calcium: 9.4 mg/dL (ref 8.6–10.3)
Chloride: 108 mmol/L (ref 98–110)
Creat: 0.94 mg/dL (ref 0.70–1.30)
Globulin: 3.1 g/dL (calc) (ref 1.9–3.7)
Glucose, Bld: 89 mg/dL (ref 65–99)
Potassium: 4 mmol/L (ref 3.5–5.3)
Sodium: 141 mmol/L (ref 135–146)
Total Bilirubin: 0.5 mg/dL (ref 0.2–1.2)
Total Protein: 7.2 g/dL (ref 6.1–8.1)
eGFR: 93 mL/min/{1.73_m2} (ref >=60)

## 2021-12-04 LAB — LIPID PANEL
Cholesterol: 193 mg/dL (ref <200)
HDL: 68 mg/dL (ref >=40)
LDL Cholesterol (Calc): 106 mg/dL (calc) — ABNORMAL HIGH
Non-HDL Cholesterol (Calc): 125 mg/dL (calc) (ref <130)
Total CHOL/HDL Ratio: 2.8 (calc) (ref <5.0)
Triglycerides: 95 mg/dL (ref <150)

## 2021-12-04 LAB — CBC
HCT: 44.3 % (ref 38.5–50.0)
Hemoglobin: 14.6 g/dL (ref 13.2–17.1)
MCH: 30.4 pg (ref 27.0–33.0)
MCHC: 33 g/dL (ref 32.0–36.0)
MCV: 92.3 fL (ref 80.0–100.0)
MPV: 9.8 fL (ref 7.5–12.5)
Platelets: 209 10*3/uL (ref 140–400)
RBC: 4.8 10*6/uL (ref 4.20–5.80)
RDW: 12.5 % (ref 11.0–15.0)
WBC: 6.8 10*3/uL (ref 3.8–10.8)

## 2021-12-04 LAB — TSH: TSH: 1.17 mIU/L (ref 0.40–4.50)

## 2021-12-04 LAB — VITAMIN D 25 HYDROXY (VIT D DEFICIENCY, FRACTURES): Vit D, 25-Hydroxy: 32 ng/mL (ref 30–100)

## 2021-12-04 LAB — PSA: PSA: 0.37 ng/mL (ref <=4.00)

## 2022-02-14 ENCOUNTER — Encounter (HOSPITAL_COMMUNITY): Payer: Self-pay

## 2022-02-14 ENCOUNTER — Emergency Department (HOSPITAL_COMMUNITY): Payer: Medicare Other

## 2022-02-14 ENCOUNTER — Emergency Department (HOSPITAL_COMMUNITY)
Admission: EM | Admit: 2022-02-14 | Discharge: 2022-02-14 | Disposition: A | Discharge status: Home / Self Care | Payer: Medicare Other | Attending: Emergency Medicine | Admitting: Emergency Medicine

## 2022-02-14 ENCOUNTER — Other Ambulatory Visit: Payer: Self-pay

## 2022-02-14 DIAGNOSIS — I10 Essential (primary) hypertension: Secondary | ICD-10-CM | POA: Insufficient documentation

## 2022-02-14 DIAGNOSIS — R059 Cough, unspecified: Secondary | ICD-10-CM | POA: Insufficient documentation

## 2022-02-14 DIAGNOSIS — Z20822 Contact with and (suspected) exposure to covid-19: Secondary | ICD-10-CM | POA: Insufficient documentation

## 2022-02-14 DIAGNOSIS — R0609 Other forms of dyspnea: Secondary | ICD-10-CM | POA: Diagnosis not present

## 2022-02-14 DIAGNOSIS — R0789 Other chest pain: Secondary | ICD-10-CM | POA: Diagnosis not present

## 2022-02-14 DIAGNOSIS — R0981 Nasal congestion: Secondary | ICD-10-CM | POA: Diagnosis not present

## 2022-02-14 DIAGNOSIS — Z79899 Other long term (current) drug therapy: Secondary | ICD-10-CM | POA: Diagnosis not present

## 2022-02-14 DIAGNOSIS — J22 Unspecified acute lower respiratory infection: Secondary | ICD-10-CM

## 2022-02-14 LAB — CBC
HCT: 39 % (ref 39.0–52.0)
Hemoglobin: 13.3 g/dL (ref 13.0–17.0)
MCH: 31 pg (ref 26.0–34.0)
MCHC: 34.1 g/dL (ref 30.0–36.0)
MCV: 90.9 fL (ref 80.0–100.0)
Platelets: 200 10*3/uL (ref 150–400)
RBC: 4.29 MIL/uL (ref 4.22–5.81)
RDW: 13.8 % (ref 11.5–15.5)
WBC: 7.1 10*3/uL (ref 4.0–10.5)
nRBC: 0 % (ref 0.0–0.2)

## 2022-02-14 LAB — BASIC METABOLIC PANEL
Anion gap: 6 (ref 5–15)
BUN: 12 mg/dL (ref 6–20)
CO2: 24 mmol/L (ref 22–32)
Calcium: 8.8 mg/dL — ABNORMAL LOW (ref 8.9–10.3)
Chloride: 110 mmol/L (ref 98–111)
Creatinine, Ser: 0.78 mg/dL (ref 0.61–1.24)
GFR, Estimated: >60 mL/min (ref >60)
Glucose, Bld: 115 mg/dL — ABNORMAL HIGH (ref 70–99)
Potassium: 3.5 mmol/L (ref 3.5–5.1)
Sodium: 140 mmol/L (ref 135–145)

## 2022-02-14 LAB — RESP PANEL BY RT-PCR (FLU A&B, COVID) ARPGX2
Influenza A by PCR: NEGATIVE
Influenza B by PCR: NEGATIVE
SARS Coronavirus 2 by RT PCR: NEGATIVE

## 2022-02-14 LAB — TROPONIN I (HIGH SENSITIVITY)
Troponin I (High Sensitivity): 7 ng/L (ref <18)
Troponin I (High Sensitivity): 6 ng/L (ref <18)

## 2022-02-14 MED ORDER — AZITHROMYCIN 250 MG PO TABS: 250.0000 mg | ORAL_TABLET | Freq: Every day | ORAL | 0 refills | Status: DC

## 2022-02-14 MED ORDER — KETOROLAC TROMETHAMINE 15 MG/ML IJ SOLN
15.0000 mg | Freq: Once | INTRAMUSCULAR | Status: AC
  Administered 2022-02-14: 15 mg via INTRAVENOUS

## 2022-02-14 MED ORDER — ALBUTEROL SULFATE HFA 108 (90 BASE) MCG/ACT IN AERS: 1.0000 | INHALATION_SPRAY | Freq: Four times a day (QID) | RESPIRATORY_TRACT | 0 refills | Status: AC | PRN

## 2022-02-14 MED ORDER — GUAIFENESIN 100 MG/5ML PO LIQD
15.0000 mL | ORAL | Status: DC | PRN
Start: 2022-02-14 — End: 2022-02-14
  Administered 2022-02-14: 15 mL via ORAL
  Filled 2022-02-14: qty 20

## 2022-02-14 MED ORDER — PREDNISONE 10 MG (21) PO TBPK: ORAL_TABLET | Freq: Every day | ORAL | 0 refills | Status: DC

## 2022-02-14 MED ORDER — DEXTROMETHORPHAN POLISTIREX ER 30 MG/5ML PO SUER
15.0000 mg | ORAL | 0 refills | Status: DC | PRN
Start: 2022-02-14 — End: 2022-11-20

## 2022-02-14 MED ORDER — IBUPROFEN 800 MG PO TABS
800.0000 mg | ORAL_TABLET | Freq: Once | ORAL | Status: DC
  Filled 2022-02-14: qty 1

## 2022-02-14 MED ORDER — GUAIFENESIN-CODEINE 100-10 MG/5ML PO SOLN: 5.0000 mL | Freq: Every evening | ORAL | 0 refills | Status: DC | PRN

## 2022-02-14 MED ORDER — ACETAMINOPHEN 325 MG PO TABS
650.0000 mg | ORAL_TABLET | Freq: Four times a day (QID) | ORAL | Status: DC | PRN
  Administered 2022-02-14: 650 mg via ORAL
  Filled 2022-02-14: qty 2

## 2022-02-14 NOTE — ED Triage Notes (Signed)
Patient c/o a productive cough with yellow sputum and SOB x 1 week. Patient c/o mid chest pain that is worse when he coughs.

## 2022-02-14 NOTE — ED Provider Notes (Signed)
[facility] DEPT Provider Note   CSN: [id number] Arrival date & time: 02/14/22  7169     History  Chief Complaint  Patient presents with   Chest Pain    Wesley Prince is a 61 y.o. male with PMHx of HTN, back pain 2/2 to [facility] presenting to the ED with cold like symptoms.   Patient presented with flulike symptoms including cough, congestion, dyspnea on exertion, and chest pain associated with a cough.  Patient states the symptoms have been present for 5 days and have worsened in the last 3 days.  His household had similar symptoms and was found to have a respiratory tract infection.  Initially he had some cough and nasal congestion but has gotten worse.  He recently started having chest pain with the cough and some back pain.  His cough is productive with yellow sputum production.  He states that has been difficult for him to sleep since onset of the symptoms.  He is denying any nausea, vomiting, diarrhea or constipation.  States does not get sick often.  He has been smoking since the age of 77 currently smoking 1/3 pack a day.   Chest Pain Pain location:  Unable to specify Pain quality: aching   Pain radiates to:  Does not radiate Pain severity:  Moderate Onset quality:  Gradual Duration:  2 days Timing:  Intermittent Progression:  Waxing and waning Chronicity:  New Context comment:  Occurs with coughing. Relieved by:  Rest Worsened by:  Coughing Ineffective treatments:  None tried Associated symptoms: back pain and cough   Associated symptoms: no abdominal pain, no dizziness, no fever and no shortness of breath        Home Medications Prior to Admission medications   Medication Sig Start Date End Date Taking? Authorizing Provider  cetirizine (ZYRTEC) 10 MG tablet Take 10 mg by mouth daily as needed. 01/24/20   [provider]  cyclobenzaprine (FLEXERIL) 10 MG tablet  02/29/20   [provider]  diclofenac (VOLTAREN) 75 MG EC  tablet  03/01/20   [provider]  fluticasone (FLONASE) 50 MCG/ACT nasal spray Place into both nostrils. 01/16/21   [provider]  gabapentin (NEURONTIN) 100 MG capsule  02/29/20   [provider]  KENALOG 40 MG/ML injection SMARTSIG:2 Milliliter(s) IM Once PRN 02/14/21   [provider]  ketorolac (TORADOL) 60 MG/2ML SOLN injection SMARTSIG:2 Milliliter(s) IM Once PRN 02/14/21   [provider]  olmesartan-hydrochlorothiazide (BENICAR HCT) 20-12.5 MG tablet Take by mouth. 01/24/20   [provider]  PROCTO-MED [facility] 2.5 % rectal cream SMARTSIG:Rectally 4 Times Daily PRN 03/21/21   [provider]  sildenafil (REVATIO) 20 MG tablet SMARTSIG:3-5 Tablet(s) By Mouth Daily PRN 01/24/20   [provider]  sildenafil (VIAGRA) 100 MG tablet Take 100 mg by mouth daily as needed. 02/23/21   [provider]  triamcinolone cream (KENALOG) 0.5 %  03/01/20   [provider]  Vitamin D, Ergocalciferol, (DRISDOL) 1.25 MG (50000 UNIT) CAPS capsule Take 50,000 Units by mouth once a week. 01/24/20   [provider]      Allergies    Patient has no known allergies.    Review of Systems   Review of Systems  Constitutional:  Negative for appetite change, chills and fever.  HENT:  Negative for ear discharge, ear pain and sore throat.   Eyes:  Negative for pain and discharge.  Respiratory:  Positive for cough. Negative for choking, chest tightness  and shortness of breath.   Cardiovascular:  Positive for chest pain. Negative for leg swelling.  Gastrointestinal:  Negative for abdominal distention and abdominal pain.  Endocrine: Negative for cold intolerance and heat intolerance.  Genitourinary:  Negative for difficulty urinating and dysuria.  Musculoskeletal:  Positive for back pain and myalgias. Negative for arthralgias and joint swelling.  Skin:  Negative for rash and wound.  Allergic/Immunologic: Negative for environmental  allergies and food allergies.  Neurological:  Negative for dizziness and facial asymmetry.  Psychiatric/Behavioral:  Negative for agitation and behavioral problems.     Physical Exam Updated Vital Signs BP (!) 151/101 (BP Location: Right Arm)   Pulse 77   Temp 99.1 F (37.3 C) (Oral)   Resp 18   Ht '5\' 10"'$  (1.778 m)   Wt 86.2 kg   SpO2 99%   BMI 27.26 kg/m  Physical Exam: GEN: NAD, laying in bed comfortably HENT: MMM, NCAT CV: NSR, 2+ radial pulses Resp: Rhonchi present bilaterally, no rales or wheeze Abd: normal bowel sounds, soft, No TTP MSK: No asymmetry, normal bulk and tone Skin: no lesions on exposed skin Neuro: alert and oriented x4 Psych: Normal mood and normal affect ED Results / Procedures / Treatments   Labs (all labs ordered are listed, but only abnormal results are displayed) Labs Reviewed  CBC  BASIC METABOLIC PANEL  TROPONIN I (HIGH SENSITIVITY)    EKG EKG Interpretation  Date/Time:  Thursday February 14 2022 09:34:13 EDT Ventricular Rate:  78 PR Interval:  153 QRS Duration: 99 QT Interval:  373 QTC Calculation: 425 R Axis:   46 Text Interpretation: Sinus rhythm RSR' in V1 or V2, right VCD or RVH Minimal ST elevation, anterior leads similar to prior tracing 3/17 Confirmed by Wynona Dove ([id number]) on 02/14/2022 9:38:22 AM  Radiology No results found.  Procedures    Medications Ordered in ED Medications - No data to display  ED Course/ Medical Decision Making/ A&P                           Medical Decision Making Patient presented with flu like symptoms including cough, congestion, dyspnea on exertion, and chest pain associated with a cough.  Diagnoses included URI versus LRI versus pneumonia versus ACS.  Most concerning was ACS so EKG and troponins were ordered and negative for ACS.  He does report sick contact so most likely is viral versus bacterial respiratory tract infection.  CBC and BMP were ordered and were unremarkable for leukocytosis or  acute kidney injury.  Chest x-ray was ordered for pneumonia but was negative.  It appears patient most likely has viral respiratory tract infection.  Since patient has strong smoking history, patient will get his underlying COPD so we will treat him with azithromycin and steroids.  Advised patient to continue conservative treatment with Tylenol, staying hydrated and completing azithromycin and steroids course.  Advised patient to follow-up with PCP to ensure resolution and check for underlying COPD given his smoking history. -Patient given Robitussin and Tylenol in the ED -Discharge was azithromycin 250 mg for 5 days and steroids course pack -Antitussives including guaifenesin-codeine 100-10 mg/5 ml and dextromethorphan as needed   Amount and/or Complexity of Data Reviewed Labs: ordered. Radiology: ordered.  Risk OTC drugs. Prescription drug management.           Final Clinical Impression(s) / ED Diagnoses Final diagnoses:  None    Rx / DC Orders ED Discharge Orders  None      Idamae Schuller, MD Tillie Rung. [facility], PGY-2    Idamae Schuller, MD 02/14/22 Wisdom, Raymer, DO 02/14/22 1628

## 2022-02-14 NOTE — Discharge Instructions (Addendum)
You presented to the ED with cold symptoms including coughing, headache, and muscle pains.  You stated that your family had similar symptoms.  Your cough has been getting worse causing some chest discomfort.  It appears your symptoms are coming from a respiratory tract infection.  We did many tests and they came back normal.  You do not have pneumonia and your heart was checked to rule out any heart related cause for the pain and that was normal as well.  This will take some time but is should resolve on its own.  I would encourage you to take tylenol up to 3000 mg a day.  Given your smoking history we will give you treatment with antibiotic and steroids to cover for COPD exacerbation caused by this infection. Please sure you are drinking plenty of water to stay hydrated.  If you get shortness of breath, fevers or chills or chest pain that does not go away please come back to the ED.  It was a pleasure caring for you today in the emergency department.  Please return to the emergency department for any worsening or worrisome symptoms.

## 2022-03-20 ENCOUNTER — Encounter (HOSPITAL_COMMUNITY): Payer: Self-pay | Admitting: *Deleted

## 2022-03-20 ENCOUNTER — Other Ambulatory Visit: Payer: Self-pay

## 2022-03-20 ENCOUNTER — Emergency Department (HOSPITAL_COMMUNITY)
Admission: EM | Admit: 2022-03-20 | Discharge: 2022-03-20 | Discharge status: Against Medical Advice | Payer: Medicare Other | Attending: Emergency Medicine | Admitting: Emergency Medicine

## 2022-03-20 DIAGNOSIS — Z5321 Procedure and treatment not carried out due to patient leaving prior to being seen by health care provider: Secondary | ICD-10-CM | POA: Insufficient documentation

## 2022-03-20 DIAGNOSIS — M25511 Pain in right shoulder: Secondary | ICD-10-CM | POA: Insufficient documentation

## 2022-03-20 DIAGNOSIS — R319 Hematuria, unspecified: Secondary | ICD-10-CM | POA: Insufficient documentation

## 2022-03-20 DIAGNOSIS — R300 Dysuria: Secondary | ICD-10-CM | POA: Insufficient documentation

## 2022-03-20 DIAGNOSIS — R109 Unspecified abdominal pain: Secondary | ICD-10-CM | POA: Diagnosis present

## 2022-03-20 DIAGNOSIS — Z87442 Personal history of urinary calculi: Secondary | ICD-10-CM | POA: Insufficient documentation

## 2022-03-20 LAB — COMPREHENSIVE METABOLIC PANEL
ALT: 30 U/L (ref 0–44)
AST: 21 U/L (ref 15–41)
Albumin: 3.8 g/dL (ref 3.5–5.0)
Alkaline Phosphatase: 37 U/L — ABNORMAL LOW (ref 38–126)
Anion gap: 9 (ref 5–15)
BUN: 20 mg/dL (ref 6–20)
CO2: 21 mmol/L — ABNORMAL LOW (ref 22–32)
Calcium: 9.2 mg/dL (ref 8.9–10.3)
Chloride: 111 mmol/L (ref 98–111)
Creatinine, Ser: 0.89 mg/dL (ref 0.61–1.24)
GFR, Estimated: >60 mL/min (ref >60)
Glucose, Bld: 111 mg/dL — ABNORMAL HIGH (ref 70–99)
Potassium: 3.2 mmol/L — ABNORMAL LOW (ref 3.5–5.1)
Sodium: 141 mmol/L (ref 135–145)
Total Bilirubin: 0.6 mg/dL (ref 0.3–1.2)
Total Protein: 7.3 g/dL (ref 6.5–8.1)

## 2022-03-20 LAB — CBC WITH DIFFERENTIAL/PLATELET
Abs Immature Granulocytes: 0.05 10*3/uL (ref 0.00–0.07)
Basophils Absolute: 0 10*3/uL (ref 0.0–0.1)
Basophils Relative: 1 %
Eosinophils Absolute: 0.1 10*3/uL (ref 0.0–0.5)
Eosinophils Relative: 1 %
HCT: 40.3 % (ref 39.0–52.0)
Hemoglobin: 13.4 g/dL (ref 13.0–17.0)
Immature Granulocytes: 1 %
Lymphocytes Relative: 33 %
Lymphs Abs: 2.1 10*3/uL (ref 0.7–4.0)
MCH: 31.1 pg (ref 26.0–34.0)
MCHC: 33.3 g/dL (ref 30.0–36.0)
MCV: 93.5 fL (ref 80.0–100.0)
Monocytes Absolute: 0.5 10*3/uL (ref 0.1–1.0)
Monocytes Relative: 7 %
Neutro Abs: 3.7 10*3/uL (ref 1.7–7.7)
Neutrophils Relative %: 57 %
Platelets: 169 10*3/uL (ref 150–400)
RBC: 4.31 MIL/uL (ref 4.22–5.81)
RDW: 12.9 % (ref 11.5–15.5)
WBC: 6.4 10*3/uL (ref 4.0–10.5)
nRBC: 0 % (ref 0.0–0.2)

## 2022-03-20 LAB — LIPASE, BLOOD: Lipase: 41 U/L (ref 11–51)

## 2022-03-20 LAB — URINALYSIS, ROUTINE W REFLEX MICROSCOPIC
Bacteria, UA: NONE SEEN
Bilirubin Urine: NEGATIVE
Glucose, UA: NEGATIVE mg/dL
Ketones, ur: NEGATIVE mg/dL
Leukocytes,Ua: NEGATIVE
Nitrite: NEGATIVE
Protein, ur: NEGATIVE mg/dL
Specific Gravity, Urine: 1.015 (ref 1.005–1.030)
pH: 5 (ref 5.0–8.0)

## 2022-03-20 NOTE — ED Notes (Signed)
People in lobby mentioned he has walked out with no intent on coming back.

## 2022-03-20 NOTE — ED Provider Triage Note (Signed)
Emergency Medicine Provider Triage Evaluation Note  Wesley Prince , a 61 y.o. male  was evaluated in triage.  Pt complains of right flank pain radiating into right groin, hx of kidney stones, feels similar. Some dysuria and hematuria. No fever, emesis. Also with right should pain, not worse with movement. No recent alls or injuries. No CP, SOB, swelling, CO  Review of Systems  Positive: Flank pain, hematuria Negative:   Physical Exam  BP 117/79 (BP Location: Left Arm)   Pulse 87   Temp 98.3 F (36.8 C) (Oral)   Resp 15   Ht '5\' 11"'$  (1.803 m)   Wt 88.5 kg   SpO2 99%   BMI 27.20 kg/m  Gen:   Awake, no distress   Resp:  Normal effort  MSK:   Moves extremities without difficulty  Other:    Medical Decision Making  Medically screening exam initiated at 4:19 PM.  Appropriate orders placed.  [name] was informed that the remainder of the evaluation will be completed by another provider, this initial triage assessment does not replace that evaluation, and the importance of remaining in the ED until their evaluation is complete.  Flank pain   Mylene Bow A, PA-C 03/20/22 1621

## 2022-03-20 NOTE — ED Triage Notes (Signed)
Pt states he started to have rt flank pain and blood in urine last week, now has a little pain in left flank. History of Kidney stones. Rt shoulder pain about same time now involves left shoulder too.

## 2022-03-21 ENCOUNTER — Emergency Department (HOSPITAL_COMMUNITY)
Admission: EM | Admit: 2022-03-21 | Discharge: 2022-03-21 | Disposition: A | Discharge status: Home / Self Care | Payer: Medicare Other | Attending: Emergency Medicine | Admitting: Emergency Medicine

## 2022-03-21 ENCOUNTER — Emergency Department (HOSPITAL_COMMUNITY): Payer: Medicare Other

## 2022-03-21 ENCOUNTER — Encounter (HOSPITAL_COMMUNITY): Payer: Self-pay

## 2022-03-21 DIAGNOSIS — R319 Hematuria, unspecified: Secondary | ICD-10-CM | POA: Diagnosis not present

## 2022-03-21 DIAGNOSIS — I10 Essential (primary) hypertension: Secondary | ICD-10-CM | POA: Insufficient documentation

## 2022-03-21 DIAGNOSIS — M25511 Pain in right shoulder: Secondary | ICD-10-CM

## 2022-03-21 DIAGNOSIS — Z79899 Other long term (current) drug therapy: Secondary | ICD-10-CM | POA: Insufficient documentation

## 2022-03-21 DIAGNOSIS — R1031 Right lower quadrant pain: Secondary | ICD-10-CM | POA: Diagnosis not present

## 2022-03-21 DIAGNOSIS — R109 Unspecified abdominal pain: Secondary | ICD-10-CM

## 2022-03-21 MED ORDER — PREDNISONE 10 MG PO TABS: 40.0000 mg | ORAL_TABLET | Freq: Every day | ORAL | 0 refills | Status: AC

## 2022-03-21 MED ORDER — OXYCODONE-ACETAMINOPHEN 5-325 MG PO TABS
1.0000 | ORAL_TABLET | Freq: Once | ORAL | Status: AC
  Administered 2022-03-21: 1 via ORAL
  Filled 2022-03-21: qty 1

## 2022-03-21 MED ORDER — PREDNISONE 10 MG PO TABS: 40.0000 mg | ORAL_TABLET | Freq: Every day | ORAL | 0 refills | Status: DC

## 2022-03-21 MED ORDER — KETOROLAC TROMETHAMINE 60 MG/2ML IM SOLN
60.0000 mg | Freq: Once | INTRAMUSCULAR | Status: AC
Start: 2022-03-21 — End: 2022-03-21
  Administered 2022-03-21: 60 mg via INTRAMUSCULAR
  Filled 2022-03-21: qty 2

## 2022-03-21 NOTE — ED Triage Notes (Addendum)
Patient arrived with right sided flank pain, hx of kidney stones in the past. Also has complaints of right shoulder pain for the last week. Took a flexeril about an hour prior to arrival with no relief.   Patient checked in earlier but could not wait to be seen, reports already being worked up just needing something to manage his pain.

## 2022-03-21 NOTE — Discharge Instructions (Addendum)
Shoulder pain-recommend applying ice to the area, taking ibuprofen Tylenol every 6 hours as needed for pain, please follow-up with orthopedics for further evaluation.  Given you steroids please take as prescribed. Hematuria-please follow-up with urology as I feel we need further evaluation for your blood in your urine.   Come back to the emergency department if you develop chest pain, shortness of breath, severe abdominal pain, uncontrolled nausea, vomiting, diarrhea.

## 2022-03-21 NOTE — ED Provider Notes (Signed)
[facility] DEPT Provider Note   CSN: [id number] Arrival date & time: 03/21/22  1025     History  Chief Complaint  Patient presents with   Flank Pain   Shoulder Pain    Wesley Prince is a 61 y.o. male.  HPI   Patient with medical history including kidney stones, hypertension, presents with complaints of right shoulder pain as well as right flank pain.  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  He also notes that he is having right flank pain, this has been going on for about 2 weeks time, states the pain is constant remained in his right flank, states he noted some blood in his urine which is intermittent, denies any dysuria hematuria no urinary frequency, states he has history of kidney stone and is concerned he might have 1, no stomach pain no nausea or vomiting no fevers or chills is no other complaints.  Reviewed patient's chart he was seen earlier today but left without being seen, lab work was obtained which included CBC, CMP, lipase, UA, bili 7 abnormality was the UA showing hemoglobin.  Home Medications Prior to Admission medications   Medication Sig Start Date End Date Taking? Authorizing Provider  predniSONE (DELTASONE) 10 MG tablet Take 4 tablets (40 mg total) by mouth daily for 5 days. 03/21/22 03/26/22 Yes Marcello Fennel, PA-C  albuterol (VENTOLIN HFA) 108 (90 Base) MCG/ACT inhaler Inhale 1-2 puffs into the lungs every 6 (six) hours as needed for wheezing or shortness of breath. 02/14/22   Jeanell Sparrow, DO  azithromycin (ZITHROMAX) 250 MG tablet Take 1 tablet (250 mg total) by mouth daily. Take first 2 tablets together, then 1 every day until finished. 02/14/22   Jeanell Sparrow,  DO  cetirizine (ZYRTEC) 10 MG tablet Take 10 mg by mouth daily as needed. 01/24/20   [provider]  cyclobenzaprine (FLEXERIL) 10 MG tablet  02/29/20   [provider]  dextromethorphan (DELSYM) 30 MG/5ML liquid Take 2.5 mLs (15 mg total) by mouth as needed for cough. 02/14/22   Jeanell Sparrow, DO  diclofenac (VOLTAREN) 75 MG EC tablet  03/01/20   [provider]  fluticasone (FLONASE) 50 MCG/ACT nasal spray Place into both nostrils. 01/16/21   [provider]  gabapentin (NEURONTIN) 100 MG capsule  02/29/20   [provider]  guaiFENesin-codeine 100-10 MG/5ML syrup Take 5 mLs by mouth at bedtime as needed for cough. 02/14/22   Jeanell Sparrow, DO  KENALOG 40 MG/ML injection SMARTSIG:2 Milliliter(s) IM Once PRN 02/14/21   [provider]  ketorolac (TORADOL) 60 MG/2ML SOLN injection SMARTSIG:2 Milliliter(s) IM Once PRN 02/14/21   [provider]  olmesartan-hydrochlorothiazide (BENICAR HCT) 20-12.5 MG tablet Take by mouth. 01/24/20   [provider]  PROCTO-MED [facility] 2.5 % rectal cream SMARTSIG:Rectally 4 Times Daily PRN 03/21/21   [provider]  sildenafil (REVATIO) 20 MG tablet SMARTSIG:3-5 Tablet(s) By Mouth Daily PRN 01/24/20   [provider]  sildenafil (VIAGRA) 100 MG tablet Take 100 mg by mouth daily as needed. 02/23/21   [provider]  triamcinolone cream (KENALOG) 0.5 %  03/01/20   [provider]  Vitamin D, Ergocalciferol, (DRISDOL)  1.25 MG (50000 UNIT) CAPS capsule Take 50,000 Units by mouth once a week. 01/24/20   [provider]      Allergies    Patient has no known allergies.    Review of Systems   Review of Systems  Constitutional:  Negative for chills and fever.  Respiratory:  Negative for shortness of breath.   Cardiovascular:  Negative for chest pain.  Gastrointestinal:  Negative for abdominal pain and vomiting.  Genitourinary:  Positive for flank pain and hematuria.   Skin:        Right shoulder pain   Neurological:  Negative for headaches.    Physical Exam Updated Vital Signs BP 110/71   Pulse 74   Temp 98.2 F (36.8 C) (Oral)   Resp 18   SpO2 94%  Physical Exam Vitals and nursing note reviewed.  Constitutional:      General: He is not in acute distress.    Appearance: He is not ill-appearing.  HENT:     Head: Normocephalic and atraumatic.     Nose: No congestion.  Eyes:     Conjunctiva/sclera: Conjunctivae normal.  Cardiovascular:     Rate and Rhythm: Normal rate and regular rhythm.     Pulses: Normal pulses.     Heart sounds: No murmur heard.    No friction rub. No gallop.  Pulmonary:     Effort: Pulmonary effort is normal.  Abdominal:     Palpations: Abdomen is soft.     Tenderness: There is no abdominal tenderness. There is no right CVA tenderness or left CVA tenderness.     Comments: Abdomen nondistended soft, nontender  Musculoskeletal:     Comments: Spine was palpated nontender to palpation no step-off deformities noted, no overlying skin changes, patient had point tenderness noted on his scapula, again no overlying skin changes, he has full range of motion his shoulder elbow and wrist, neurovascular fully intact, he has soft compartments.  Patient's right flank was palpated was nontender to palpation.    Skin:    General: Skin is warm and dry.  Neurological:     Mental Status: He is alert.  Psychiatric:        Mood and Affect: Mood normal.     ED Results / Procedures / Treatments   Labs (all labs ordered are listed, but only abnormal results are displayed) Labs Reviewed - No data to display  EKG None  Radiology CT Renal Stone Study  Result Date: 03/21/2022 CLINICAL DATA:  Flank pain, kidney stone suspected. Right flank pain EXAM: CT ABDOMEN AND PELVIS WITHOUT CONTRAST TECHNIQUE: Multidetector CT imaging of the abdomen and pelvis was performed following the standard protocol without IV contrast. RADIATION DOSE  REDUCTION: This exam was performed according to the departmental dose-optimization program which includes automated exposure control, adjustment of the mA and/or kV according to patient size and/or use of iterative reconstruction technique. COMPARISON:  05/05/2017 FINDINGS: Lower chest: No acute abnormality. Hepatobiliary: Mild hepatic steatosis. No definite intrahepatic mass on this noncontrast examination. No intra or extrahepatic biliary ductal dilation. Gallbladder unremarkable. Pancreas: Unremarkable Spleen: Unremarkable Adrenals/Urinary Tract: Adrenal glands are unremarkable. Kidneys are normal, without renal calculi, focal lesion, or hydronephrosis. Bladder is unremarkable. Stomach/Bowel: Stomach is within normal limits. Appendix appears normal. No evidence of bowel wall thickening, distention, or inflammatory changes. Vascular/Lymphatic: No significant vascular findings are present. No enlarged abdominal or pelvic lymph nodes. Reproductive: Prostate is unremarkable. Other: Tiny fat containing umbilical hernia. Musculoskeletal: Degenerative changes are seen within the lumbar  spine. No acute bone abnormality. IMPRESSION: 1. No acute intra-abdominal pathology identified. No radiographic explanation for the patient's reported symptoms. 2. Mild hepatic steatosis. Electronically Signed   By: Fidela Salisbury M.D.   On: 03/21/2022 03:45    Procedures Procedures    Medications Ordered in ED Medications  ketorolac (TORADOL) injection 60 mg (60 mg Intramuscular Given 03/21/22 0515)  oxyCODONE-acetaminophen (PERCOCET/ROXICET) 5-325 MG per tablet 1 tablet (1 tablet Oral Given 03/21/22 0515)    ED Course/ Medical Decision Making/ A&P                           Medical Decision Making Amount and/or Complexity of Data Reviewed Radiology: ordered.  Risk Prescription drug management.   This patient presents to the ED for concern of shoulder pain and flank pain, this involves an extensive number of treatment  options, and is a complaint that carries with it a high risk of complications and morbidity.  The differential diagnosis includes fracture, dislocation, pyelo-, kidney stone    Additional history obtained:  Additional history obtained from N/A External records from outside source obtained and reviewed including previous ER notes   Co morbidities that complicate the patient evaluation  Kidney stones  Social Determinants of Health:  N/A    Lab Tests:  I Ordered, and personally interpreted labs.  The pertinent results include: CBC which was unremarkable, lipase which was unremarkable, CMP shows potassium 3.2 CO2 of 21 glucose 111, UA shows hemoglobin.   Imaging Studies ordered:  I ordered imaging studies including CT renal I independently visualized and interpreted imaging which showed negative for acute findings I agree with the radiologist interpretation   Cardiac Monitoring:  The patient was maintained on a cardiac monitor.  I personally viewed and interpreted the cardiac monitored which showed an underlying rhythm of: N/A   Medicines ordered and prescription drug management:  I ordered medication including Toradol, oxycodone I have reviewed the patients home medicines and have made adjustments as needed  Critical Interventions:  N/A   Reevaluation:  Presents with flank pain, CT imaging was obtained it was unremarkable.  Patient's physical exam was benign, he has no complaints, he is agreement with plan discharge at this time.    Consultations Obtained:  N/A    Test Considered:  N/A    Rule out I have low suspicion for spinal abnormality a spine was palpated was nontender to palpation no step-off or deformities noted, he has 5/5 strength neurovascular intact in  the upper extremities.  I have low suspicion for fracture of the scapula is no traumatic injury, there is no deformities noted, no increased risk for pathological fractures.  I doubt septic  arthritis as there is no evidence of infection noted on my exam, he is nontachycardic afebrile with no IV drug use.  I have low suspicion for UTI, pyelo-, kidney stone UA is unremarkable for signs of hematuria or infection. Low  Suspicion for bowel obstruction, volvulus, AAA, CT imaging are all negative these findings.  I doubt intra-abdominal abnormality as he has nonsurgical abdomen.     Dispostion and problem list  After consideration of the diagnostic results and the patients response to treatment, I feel that the patent would benefit from discharge.  Shoulder pain-likely muscular, but possible arthritis, will have him continue with NSAIDs, follow-up with Ortho for further evaluation. Flank pain-unclear etiology but I suspect more muscular in nature as he has a history of chronic back pain possible  this is pain radiating from his back, we will have him follow-up with PCP for reassessment. Hematuria-we will have follow-up with urology.            Final Clinical Impression(s) / ED Diagnoses Final diagnoses:  Acute pain of right shoulder  Right flank pain  Hematuria, unspecified type    Rx / DC Orders ED Discharge Orders          Ordered    predniSONE (DELTASONE) 10 MG tablet  Daily        03/21/22 [place], Coreen [place] J, PA-C 03/21/22 0538    Palumbo, April, MD 03/21/22 [phone]

## 2022-03-21 NOTE — ED Notes (Signed)
Patient transported to CT 

## 2022-04-16 ENCOUNTER — Ambulatory Visit: Payer: Medicare Other | Admitting: Podiatry

## 2022-05-13 ENCOUNTER — Ambulatory Visit (INDEPENDENT_AMBULATORY_CARE_PROVIDER_SITE_OTHER): Payer: Medicare Other | Admitting: Podiatry

## 2022-05-13 ENCOUNTER — Ambulatory Visit (INDEPENDENT_AMBULATORY_CARE_PROVIDER_SITE_OTHER): Payer: Medicare Other

## 2022-05-13 DIAGNOSIS — M216X1 Other acquired deformities of right foot: Secondary | ICD-10-CM

## 2022-05-13 DIAGNOSIS — M21619 Bunion of unspecified foot: Secondary | ICD-10-CM | POA: Diagnosis not present

## 2022-05-13 DIAGNOSIS — M216X2 Other acquired deformities of left foot: Secondary | ICD-10-CM

## 2022-05-13 DIAGNOSIS — M79671 Pain in right foot: Secondary | ICD-10-CM

## 2022-05-13 DIAGNOSIS — M25879 Other specified joint disorders, unspecified ankle and foot: Secondary | ICD-10-CM | POA: Diagnosis not present

## 2022-05-13 DIAGNOSIS — M79672 Pain in left foot: Secondary | ICD-10-CM

## 2022-05-13 NOTE — Progress Notes (Unsigned)
Subjective: Chief Complaint  Patient presents with   Callouses    Patient came in today for bilateral callus under the hallux, started 2 weeks ago, rate of pain 8 out of 10, new orthotics    61 year old male presents the office today with concerns of pain on both of his feet.  He has calluses underneath both of his big toes which are about 2 weeks ago was been ongoing issue longer became more symptomatic.  No recent injuries.  No open lesions.  No swelling redness or drainage.  Objective: AAO x3, NAD DP/PT pulses palpable bilaterally, CRT less than 3 seconds Hyperkeratotic lesion submetatarsal 1 on the sesamoids as well as the medial hallux with right side worse than left.  There is no ongoing ulceration drainage or signs of infection.  Tenderness on the sesamoid complex.  No pain in the dorsal MPJ or pain with MPJ range of motion noted. No pain with calf compression, swelling, warmth, erythema  Assessment: Prominent metatarsal heads, callus formation right side worse than left  Plan: -All treatment options discussed with the patient including all alternatives, risks, complications.  -As a courtesy debride the calluses with any complications or bleeding.  I think his symptoms are coming from the prominent metatarsal heads and gait abnormality.  He wants to proceed with the orthotics.  He was measured for new inserts and I did mark the areas with calluses to help further offload. -Patient encouraged to call the office with any questions, concerns, change in symptoms.   Trula Slade DPM

## 2022-05-16 ENCOUNTER — Other Ambulatory Visit: Payer: Self-pay | Admitting: Podiatry

## 2022-05-16 DIAGNOSIS — M25879 Other specified joint disorders, unspecified ankle and foot: Secondary | ICD-10-CM

## 2022-05-16 DIAGNOSIS — M216X2 Other acquired deformities of left foot: Secondary | ICD-10-CM

## 2022-05-16 DIAGNOSIS — M21619 Bunion of unspecified foot: Secondary | ICD-10-CM

## 2022-05-16 DIAGNOSIS — M79671 Pain in right foot: Secondary | ICD-10-CM

## 2022-05-16 DIAGNOSIS — M216X1 Other acquired deformities of right foot: Secondary | ICD-10-CM

## 2022-05-24 ENCOUNTER — Other Ambulatory Visit: Payer: Self-pay

## 2022-05-24 ENCOUNTER — Encounter: Payer: Self-pay | Admitting: Physical Therapy

## 2022-05-24 ENCOUNTER — Ambulatory Visit: Payer: Medicare Other | Attending: Physical Medicine and Rehabilitation | Admitting: Physical Therapy

## 2022-05-24 DIAGNOSIS — M25511 Pain in right shoulder: Secondary | ICD-10-CM | POA: Insufficient documentation

## 2022-05-24 DIAGNOSIS — M546 Pain in thoracic spine: Secondary | ICD-10-CM | POA: Insufficient documentation

## 2022-05-24 DIAGNOSIS — M6281 Muscle weakness (generalized): Secondary | ICD-10-CM | POA: Insufficient documentation

## 2022-05-24 DIAGNOSIS — G8929 Other chronic pain: Secondary | ICD-10-CM | POA: Diagnosis present
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
## 2022-05-28 ENCOUNTER — Ambulatory Visit: Payer: Medicare Other | Admitting: Physical Therapy

## 2022-05-28 ENCOUNTER — Encounter: Payer: Self-pay | Admitting: Physical Therapy

## 2022-05-28 DIAGNOSIS — M546 Pain in thoracic spine: Secondary | ICD-10-CM

## 2022-05-28 DIAGNOSIS — G8929 Other chronic pain: Secondary | ICD-10-CM

## 2022-05-28 DIAGNOSIS — M6281 Muscle weakness (generalized): Secondary | ICD-10-CM
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
## 2022-06-01 ENCOUNTER — Ambulatory Visit: Payer: Medicare Other

## 2022-06-01 DIAGNOSIS — M546 Pain in thoracic spine: Secondary | ICD-10-CM

## 2022-06-01 DIAGNOSIS — M6281 Muscle weakness (generalized): Secondary | ICD-10-CM

## 2022-06-01 DIAGNOSIS — G8929 Other chronic pain: Secondary | ICD-10-CM
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
## 2022-06-03 ENCOUNTER — Ambulatory Visit: Payer: Medicare Other

## 2022-06-03 DIAGNOSIS — M546 Pain in thoracic spine: Secondary | ICD-10-CM | POA: Diagnosis not present

## 2022-06-03 DIAGNOSIS — M6281 Muscle weakness (generalized): Secondary | ICD-10-CM

## 2022-06-03 DIAGNOSIS — G8929 Other chronic pain: Secondary | ICD-10-CM

## 2022-06-10 ENCOUNTER — Other Ambulatory Visit: Payer: Medicare Other

## 2022-06-11 ENCOUNTER — Ambulatory Visit: Payer: Medicare Other

## 2022-06-11 ENCOUNTER — Telehealth: Payer: Self-pay | Admitting: Podiatry

## 2022-06-11 DIAGNOSIS — G8929 Other chronic pain: Secondary | ICD-10-CM

## 2022-06-11 DIAGNOSIS — M546 Pain in thoracic spine: Secondary | ICD-10-CM

## 2022-06-11 DIAGNOSIS — M6281 Muscle weakness (generalized): Secondary | ICD-10-CM

## 2022-06-11 NOTE — Telephone Encounter (Signed)
Left message on vm to reschedule missed appt to pick up orthotics.
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
## 2022-06-12 ENCOUNTER — Ambulatory Visit (INDEPENDENT_AMBULATORY_CARE_PROVIDER_SITE_OTHER): Payer: Medicare Other

## 2022-06-12 DIAGNOSIS — M216X1 Other acquired deformities of right foot: Secondary | ICD-10-CM

## 2022-06-12 NOTE — Progress Notes (Signed)
Patient presents today to pick up custom molded foot orthotics, diagnosed with prominent by Dr. Jacqualyn Posey.   Orthotics were dispensed and fit was satisfactory. Reviewed instructions for break-in and wear. Written instructions given to patient.  Patient will follow up as needed.   [facility] - order # [id number]

## 2022-06-14 ENCOUNTER — Ambulatory Visit: Payer: Medicare Other | Admitting: Physical Therapy

## 2022-06-18 ENCOUNTER — Encounter: Payer: Self-pay | Admitting: Physical Therapy

## 2022-06-18 ENCOUNTER — Ambulatory Visit: Payer: Medicare Other | Attending: Physical Medicine and Rehabilitation | Admitting: Physical Therapy

## 2022-06-18 DIAGNOSIS — M6281 Muscle weakness (generalized): Secondary | ICD-10-CM | POA: Insufficient documentation

## 2022-06-18 DIAGNOSIS — M546 Pain in thoracic spine: Secondary | ICD-10-CM | POA: Diagnosis present

## 2022-06-18 DIAGNOSIS — G8929 Other chronic pain: Secondary | ICD-10-CM | POA: Diagnosis present

## 2022-06-18 DIAGNOSIS — M25511 Pain in right shoulder: Secondary | ICD-10-CM | POA: Diagnosis present
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
## 2022-06-22 ENCOUNTER — Telehealth: Payer: Self-pay

## 2022-06-22 ENCOUNTER — Ambulatory Visit: Payer: Medicare Other

## 2022-06-22 NOTE — Telephone Encounter (Signed)
LVM regarding missed appointment. Patient called back to state he overslept and will be at his next appointment.   1st no-show  Margarette Canada, [place] 06/22/22 10:35 AM

## 2022-06-24 ENCOUNTER — Ambulatory Visit: Payer: Medicare Other

## 2022-06-24 ENCOUNTER — Other Ambulatory Visit: Payer: Medicare Other

## 2022-06-24 DIAGNOSIS — G8929 Other chronic pain: Secondary | ICD-10-CM

## 2022-06-24 DIAGNOSIS — M546 Pain in thoracic spine: Secondary | ICD-10-CM

## 2022-06-24 DIAGNOSIS — M6281 Muscle weakness (generalized): Secondary | ICD-10-CM

## 2022-06-24 DIAGNOSIS — M25511 Pain in right shoulder: Secondary | ICD-10-CM | POA: Diagnosis not present
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
## 2022-06-28 ENCOUNTER — Ambulatory Visit: Payer: Medicare Other | Admitting: Physical Therapy

## 2022-06-28 ENCOUNTER — Encounter: Payer: Self-pay | Admitting: Physical Therapy

## 2022-06-28 DIAGNOSIS — M25511 Pain in right shoulder: Secondary | ICD-10-CM | POA: Diagnosis not present

## 2022-06-28 DIAGNOSIS — M546 Pain in thoracic spine: Secondary | ICD-10-CM

## 2022-06-28 DIAGNOSIS — G8929 Other chronic pain: Secondary | ICD-10-CM
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
## 2022-07-01 ENCOUNTER — Ambulatory Visit: Payer: Medicare Other | Admitting: Physical Therapy

## 2022-07-02 ENCOUNTER — Encounter: Payer: Medicare Other | Admitting: Physical Therapy
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
## 2022-11-17 ENCOUNTER — Emergency Department (HOSPITAL_COMMUNITY)
Admission: EM | Admit: 2022-11-17 | Discharge: 2022-11-17 | Disposition: A | Discharge status: Home / Self Care | Payer: 59 | Attending: Student | Admitting: Student

## 2022-11-17 ENCOUNTER — Other Ambulatory Visit: Payer: Self-pay

## 2022-11-17 ENCOUNTER — Encounter (HOSPITAL_COMMUNITY): Payer: Self-pay | Admitting: *Deleted

## 2022-11-17 ENCOUNTER — Emergency Department (HOSPITAL_COMMUNITY): Payer: 59

## 2022-11-17 DIAGNOSIS — I10 Essential (primary) hypertension: Secondary | ICD-10-CM | POA: Insufficient documentation

## 2022-11-17 DIAGNOSIS — R0789 Other chest pain: Secondary | ICD-10-CM | POA: Diagnosis present

## 2022-11-17 LAB — CBC
HCT: 40.2 % (ref 39.0–52.0)
Hemoglobin: 14.1 g/dL (ref 13.0–17.0)
MCH: 30.5 pg (ref 26.0–34.0)
MCHC: 35.1 g/dL (ref 30.0–36.0)
MCV: 87 fL (ref 80.0–100.0)
Platelets: 202 10*3/uL (ref 150–400)
RBC: 4.62 MIL/uL (ref 4.22–5.81)
RDW: 13.2 % (ref 11.5–15.5)
WBC: 10.5 10*3/uL (ref 4.0–10.5)
nRBC: 0 % (ref 0.0–0.2)

## 2022-11-17 LAB — BASIC METABOLIC PANEL
Anion gap: 12 (ref 5–15)
BUN: 12 mg/dL (ref 6–20)
CO2: 18 mmol/L — ABNORMAL LOW (ref 22–32)
Calcium: 9.6 mg/dL (ref 8.9–10.3)
Chloride: 108 mmol/L (ref 98–111)
Creatinine, Ser: 0.94 mg/dL (ref 0.61–1.24)
GFR, Estimated: >60 mL/min (ref >60)
Glucose, Bld: 88 mg/dL (ref 70–99)
Potassium: 3.7 mmol/L (ref 3.5–5.1)
Sodium: 138 mmol/L (ref 135–145)

## 2022-11-17 LAB — TROPONIN I (HIGH SENSITIVITY)
Troponin I (High Sensitivity): 8 ng/L (ref <18)
Troponin I (High Sensitivity): 7 ng/L (ref <18)

## 2022-11-17 LAB — D-DIMER, QUANTITATIVE: D-Dimer, Quant: 0.45 ug/mL-FEU (ref 0.00–0.50)

## 2022-11-17 MED ORDER — LIDOCAINE 5 % EX PTCH: 1.0000 | MEDICATED_PATCH | CUTANEOUS | 0 refills | Status: AC

## 2022-11-17 MED ORDER — NAPROXEN 375 MG PO TABS: 375.0000 mg | ORAL_TABLET | Freq: Two times a day (BID) | ORAL | 0 refills | Status: DC

## 2022-11-17 MED ORDER — ACETAMINOPHEN 500 MG PO TABS: 1000.0000 mg | ORAL_TABLET | Freq: Three times a day (TID) | ORAL | 0 refills | Status: DC

## 2022-11-17 MED ORDER — KETOROLAC TROMETHAMINE 15 MG/ML IJ SOLN: 15.0000 mg | Freq: Once | INTRAMUSCULAR | Status: DC

## 2022-11-17 MED ORDER — KETOROLAC TROMETHAMINE 15 MG/ML IJ SOLN
15.0000 mg | Freq: Once | INTRAMUSCULAR | Status: AC
  Administered 2022-11-17: 15 mg via INTRAVENOUS
  Filled 2022-11-17: qty 1

## 2022-11-17 MED ORDER — NAPROXEN 375 MG PO TABS: 375.0000 mg | ORAL_TABLET | Freq: Two times a day (BID) | ORAL | 0 refills | Status: AC

## 2022-11-17 MED ORDER — LIDOCAINE 5 % EX PTCH
1.0000 | MEDICATED_PATCH | CUTANEOUS | Status: DC
  Administered 2022-11-17: 1 via TRANSDERMAL
  Filled 2022-11-17: qty 1

## 2022-11-17 NOTE — Discharge Instructions (Addendum)
For pain:  - Acetaminophen 1000 mg three times daily (every 8 hours) - Naproxen 2 times daily (every 12 hours) for 7 days - lidoderm patches every 12 hours as needed
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
## 2022-11-17 NOTE — ED Triage Notes (Signed)
Pt here via [facility] for chest pain.  Pain began after moving furniture with daughter.  Given 324 asa en-route.  Took med for erectile disfunction, so was not given nitro per ems.

## 2022-11-20 ENCOUNTER — Encounter (HOSPITAL_BASED_OUTPATIENT_CLINIC_OR_DEPARTMENT_OTHER): Payer: Self-pay | Admitting: Orthopedic Surgery

## 2022-11-20 ENCOUNTER — Other Ambulatory Visit: Payer: Self-pay

## 2022-11-20 NOTE — Progress Notes (Signed)
   11/20/22 1116  PAT Phone Screen  Is the patient taking a GLP-1 receptor agonist? No  Do You Have Diabetes? No  Do You Have Hypertension? Yes  Have You Ever Been to the ER for Asthma? No  Have You Taken Oral Steroids in the Past 3 Months? No  Do you Take Phenteramine or any Other Diet Drugs? No  Recent  Lab Work, EKG, CXR? Yes  Where was this test performed? EKG NSR [facility] 11/17/22  Do you have a history of heart problems? No  Any Recent Hospitalizations? (S)  No (IN ED on 11/17/22 w/ CP p crack use. Diagnosed with chest strain. Reviewed with Dr Arby Barrette- will bring in early and get UDS on dos.)  Height 5\' 11"  (1.803 m)  Weight 88.5 kg  Pat Appointment Scheduled (S)  No (9a dos for UDS)  Reason for No Appointment Not Needed

## 2022-11-21 NOTE — Progress Notes (Signed)

## 2022-11-25 NOTE — Anesthesia Preprocedure Evaluation (Signed)
Anesthesia Evaluation  Patient identified by MRN, date of birth, ID band Patient awake    Reviewed: Allergy & Precautions, NPO status , Patient's Chart, lab work & pertinent test results  History of Anesthesia Complications Negative for: history of anesthetic complications  Airway Mallampati: III  TM Distance: >3 FB Neck ROM: Full    Dental  (+) Dental Advisory Given   Pulmonary neg shortness of breath, neg sleep apnea, neg COPD, neg recent URI, Current Smoker   Pulmonary exam normal breath sounds clear to auscultation       Cardiovascular hypertension (olmesartan-HCTZ), Pt. on medications (-) angina (-) Past MI, (-) Cardiac Stents and (-) CABG (-) dysrhythmias  Rhythm:Regular Rate:Normal     Neuro/Psych neg Seizures  Neuromuscular disease (lumbar spinal stenosis, h/o C6 fracture)    GI/Hepatic PUD,,,(+)     substance abuse  cocaine use and marijuana use  Endo/Other  negative endocrine ROS    Renal/GU Renal disease: nephrolithiasis.negative Renal ROS     Musculoskeletal   Abdominal  (+) - obese  Peds  Hematology negative hematology ROS (+)   Anesthesia Other Findings Patient reports last marijuana use was 3 days ago. It was laced with cocaine. That was his last use.  Reproductive/Obstetrics                             Anesthesia Physical Anesthesia Plan  ASA: 3  Anesthesia Plan: General   Post-op Pain Management: Regional block* and Tylenol PO (pre-op)*   Induction: Intravenous  PONV Risk Score and Plan: 1 and Ondansetron, Dexamethasone and Treatment may vary due to age or medical condition  Airway Management Planned: Oral ETT  Additional Equipment:   Intra-op Plan:   Post-operative Plan: Extubation in OR  Informed Consent: I have reviewed the patients History and Physical, chart, labs and discussed the procedure including the risks, benefits and alternatives for the  proposed anesthesia with the patient or authorized representative who has indicated his/her understanding and acceptance.     Dental advisory given  Plan Discussed with: CRNA and Anesthesiologist  Anesthesia Plan Comments: (Risks of general anesthesia discussed including, but not limited to, sore throat, hoarse voice, chipped/damaged teeth, injury to vocal cords, nausea and vomiting, allergic reactions, lung infection, heart attack, stroke, and death. All questions answered.  Discussed potential risks of nerve blocks including, but not limited to, infection, bleeding, nerve damage, seizures, pneumothorax, respiratory depression, and potential failure of the block. Alternatives to nerve blocks discussed. All questions answered. )       Anesthesia Quick Evaluation

## 2022-11-26 ENCOUNTER — Ambulatory Visit (HOSPITAL_BASED_OUTPATIENT_CLINIC_OR_DEPARTMENT_OTHER): Payer: 59 | Admitting: Anesthesiology

## 2022-11-26 ENCOUNTER — Encounter (HOSPITAL_BASED_OUTPATIENT_CLINIC_OR_DEPARTMENT_OTHER): Payer: Self-pay | Admitting: Orthopedic Surgery

## 2022-11-26 ENCOUNTER — Ambulatory Visit (HOSPITAL_BASED_OUTPATIENT_CLINIC_OR_DEPARTMENT_OTHER)
Admission: RE | Admit: 2022-11-26 | Discharge: 2022-11-26 | Disposition: A | Discharge status: Home / Self Care | Payer: 59 | Attending: Orthopedic Surgery | Admitting: Orthopedic Surgery

## 2022-11-26 ENCOUNTER — Encounter (HOSPITAL_BASED_OUTPATIENT_CLINIC_OR_DEPARTMENT_OTHER)
Admission: RE | Disposition: A | Discharge status: Home / Self Care | Payer: Self-pay | Source: Home / Self Care | Attending: Orthopedic Surgery

## 2022-11-26 ENCOUNTER — Other Ambulatory Visit: Payer: Self-pay

## 2022-11-26 DIAGNOSIS — F17210 Nicotine dependence, cigarettes, uncomplicated: Secondary | ICD-10-CM | POA: Diagnosis not present

## 2022-11-26 DIAGNOSIS — I10 Essential (primary) hypertension: Secondary | ICD-10-CM

## 2022-11-26 DIAGNOSIS — X58XXXA Exposure to other specified factors, initial encounter: Secondary | ICD-10-CM | POA: Insufficient documentation

## 2022-11-26 DIAGNOSIS — F17200 Nicotine dependence, unspecified, uncomplicated: Secondary | ICD-10-CM | POA: Insufficient documentation

## 2022-11-26 DIAGNOSIS — S43431A Superior glenoid labrum lesion of right shoulder, initial encounter: Secondary | ICD-10-CM | POA: Insufficient documentation

## 2022-11-26 DIAGNOSIS — F1290 Cannabis use, unspecified, uncomplicated: Secondary | ICD-10-CM | POA: Diagnosis not present

## 2022-11-26 DIAGNOSIS — M19011 Primary osteoarthritis, right shoulder: Secondary | ICD-10-CM | POA: Insufficient documentation

## 2022-11-26 DIAGNOSIS — M7521 Bicipital tendinitis, right shoulder: Secondary | ICD-10-CM | POA: Diagnosis not present

## 2022-11-26 DIAGNOSIS — Z79899 Other long term (current) drug therapy: Secondary | ICD-10-CM | POA: Diagnosis not present

## 2022-11-26 DIAGNOSIS — Z01818 Encounter for other preprocedural examination: Secondary | ICD-10-CM

## 2022-11-26 LAB — RAPID URINE DRUG SCREEN, HOSP PERFORMED
Amphetamines: NOT DETECTED
Barbiturates: NOT DETECTED
Benzodiazepines: NOT DETECTED
Cocaine: POSITIVE — AB
Opiates: NOT DETECTED
Tetrahydrocannabinol: POSITIVE — AB

## 2022-11-26 SURGERY — SHOULDER ARTHROSCOPY WITH SUBACROMIAL DECOMPRESSION AND DISTAL CLAVICLE EXCISION
Anesthesia: General | Site: Shoulder | Laterality: Right

## 2022-11-26 MED ORDER — OXYCODONE HCL 5 MG/5ML PO SOLN: 5.0000 mg | Freq: Once | ORAL | Status: DC | PRN

## 2022-11-26 MED ORDER — PHENYLEPHRINE HCL-NACL 20-0.9 MG/250ML-% IV SOLN
INTRAVENOUS | Status: DC | PRN
  Administered 2022-11-26: 50 ug/min via INTRAVENOUS

## 2022-11-26 MED ORDER — FENTANYL CITRATE (PF) 100 MCG/2ML IJ SOLN
INTRAMUSCULAR | Status: AC
  Filled 2022-11-26: qty 2

## 2022-11-26 MED ORDER — LIDOCAINE 2% (20 MG/ML) 5 ML SYRINGE
INTRAMUSCULAR | Status: DC | PRN
  Administered 2022-11-26: 100 mg via INTRAVENOUS

## 2022-11-26 MED ORDER — EPHEDRINE SULFATE-NACL 50-0.9 MG/10ML-% IV SOSY
PREFILLED_SYRINGE | INTRAVENOUS | Status: DC | PRN
  Administered 2022-11-26 (×2): 5 mg via INTRAVENOUS

## 2022-11-26 MED ORDER — PHENYLEPHRINE 80 MCG/ML (10ML) SYRINGE FOR IV PUSH (FOR BLOOD PRESSURE SUPPORT)
PREFILLED_SYRINGE | INTRAVENOUS | Status: DC | PRN
  Administered 2022-11-26 (×2): 80 ug via INTRAVENOUS

## 2022-11-26 MED ORDER — MIDAZOLAM HCL 2 MG/2ML IJ SOLN
2.0000 mg | Freq: Once | INTRAMUSCULAR | Status: AC
  Administered 2022-11-26: 2 mg via INTRAVENOUS

## 2022-11-26 MED ORDER — ASPIRIN 81 MG PO CHEW: 81.0000 mg | CHEWABLE_TABLET | Freq: Two times a day (BID) | ORAL | 0 refills | Status: AC

## 2022-11-26 MED ORDER — FENTANYL CITRATE (PF) 100 MCG/2ML IJ SOLN
25.0000 ug | INTRAMUSCULAR | Status: DC | PRN
  Administered 2022-11-26 (×2): 50 ug via INTRAVENOUS

## 2022-11-26 MED ORDER — MIDAZOLAM HCL 2 MG/2ML IJ SOLN
INTRAMUSCULAR | Status: AC
  Filled 2022-11-26: qty 2

## 2022-11-26 MED ORDER — ONDANSETRON HCL 4 MG PO TABS: 4.0000 mg | ORAL_TABLET | Freq: Three times a day (TID) | ORAL | 0 refills | Status: AC | PRN

## 2022-11-26 MED ORDER — AMISULPRIDE (ANTIEMETIC) 5 MG/2ML IV SOLN
10.0000 mg | Freq: Once | INTRAVENOUS | Status: AC | PRN
  Administered 2022-11-26: 10 mg via INTRAVENOUS

## 2022-11-26 MED ORDER — OXYCODONE HCL 5 MG PO TABS: ORAL_TABLET | ORAL | 0 refills | Status: AC

## 2022-11-26 MED ORDER — PROPOFOL 10 MG/ML IV BOLUS
INTRAVENOUS | Status: DC | PRN
  Administered 2022-11-26: 200 mg via INTRAVENOUS

## 2022-11-26 MED ORDER — DEXAMETHASONE SODIUM PHOSPHATE 10 MG/ML IJ SOLN
INTRAMUSCULAR | Status: AC
  Filled 2022-11-26: qty 1

## 2022-11-26 MED ORDER — ROCURONIUM BROMIDE 10 MG/ML (PF) SYRINGE
PREFILLED_SYRINGE | INTRAVENOUS | Status: DC | PRN
  Administered 2022-11-26: 50 mg via INTRAVENOUS

## 2022-11-26 MED ORDER — CEFAZOLIN SODIUM-DEXTROSE 2-4 GM/100ML-% IV SOLN
INTRAVENOUS | Status: AC
  Filled 2022-11-26: qty 100

## 2022-11-26 MED ORDER — CEFAZOLIN SODIUM-DEXTROSE 2-4 GM/100ML-% IV SOLN
2.0000 g | INTRAVENOUS | Status: AC
  Administered 2022-11-26: 2 g via INTRAVENOUS

## 2022-11-26 MED ORDER — ACETAMINOPHEN 500 MG PO TABS
1000.0000 mg | ORAL_TABLET | Freq: Once | ORAL | Status: AC
  Administered 2022-11-26: 1000 mg via ORAL

## 2022-11-26 MED ORDER — ACETAMINOPHEN 500 MG PO TABS: 1000.0000 mg | ORAL_TABLET | Freq: Three times a day (TID) | ORAL | 0 refills | Status: AC

## 2022-11-26 MED ORDER — FENTANYL CITRATE (PF) 100 MCG/2ML IJ SOLN
INTRAMUSCULAR | Status: DC | PRN
  Administered 2022-11-26: 100 ug via INTRAVENOUS

## 2022-11-26 MED ORDER — PROPOFOL 10 MG/ML IV BOLUS
INTRAVENOUS | Status: AC
  Filled 2022-11-26: qty 20

## 2022-11-26 MED ORDER — OXYCODONE HCL 5 MG PO TABS: 5.0000 mg | ORAL_TABLET | Freq: Once | ORAL | Status: DC | PRN

## 2022-11-26 MED ORDER — DICLOFENAC SODIUM 75 MG PO TBEC: 75.0000 mg | DELAYED_RELEASE_TABLET | Freq: Two times a day (BID) | ORAL | 0 refills | Status: AC

## 2022-11-26 MED ORDER — FENTANYL CITRATE (PF) 100 MCG/2ML IJ SOLN
100.0000 ug | Freq: Once | INTRAMUSCULAR | Status: AC
  Administered 2022-11-26: 50 ug via INTRAVENOUS

## 2022-11-26 MED ORDER — AMISULPRIDE (ANTIEMETIC) 5 MG/2ML IV SOLN
INTRAVENOUS | Status: AC
  Filled 2022-11-26: qty 4

## 2022-11-26 MED ORDER — PHENYLEPHRINE HCL (PRESSORS) 10 MG/ML IV SOLN
INTRAVENOUS | Status: AC
  Filled 2022-11-26: qty 1

## 2022-11-26 MED ORDER — POVIDONE-IODINE 10 % EX SWAB: 2.0000 | Freq: Once | CUTANEOUS | Status: DC

## 2022-11-26 MED ORDER — SUGAMMADEX SODIUM 200 MG/2ML IV SOLN
INTRAVENOUS | Status: DC | PRN
  Administered 2022-11-26: 200 mg via INTRAVENOUS

## 2022-11-26 MED ORDER — BUPIVACAINE LIPOSOME 1.3 % IJ SUSP
INTRAMUSCULAR | Status: DC | PRN
  Administered 2022-11-26: 10 mL via PERINEURAL

## 2022-11-26 MED ORDER — ACETAMINOPHEN 500 MG PO TABS
ORAL_TABLET | ORAL | Status: AC
  Filled 2022-11-26: qty 2

## 2022-11-26 MED ORDER — SODIUM CHLORIDE 0.9 % IR SOLN
Status: DC | PRN
  Administered 2022-11-26: 6000 mL

## 2022-11-26 MED ORDER — LACTATED RINGERS IV SOLN: INTRAVENOUS | Status: DC

## 2022-11-26 MED ORDER — DEXAMETHASONE SODIUM PHOSPHATE 10 MG/ML IJ SOLN
8.0000 mg | Freq: Once | INTRAMUSCULAR | Status: AC
  Administered 2022-11-26: 10 mg via INTRAVENOUS

## 2022-11-26 SURGICAL SUPPLY — 56 items
AID PSTN UNV HD RSTRNT DISP (MISCELLANEOUS) ×1
ANCH SUT 2.9 PUSHLOCK ANCH (Orthopedic Implant) ×1 IMPLANT
ANCH SUT FBRTAPE 1.3X2.6X1.7 (Anchor) ×1 IMPLANT
ANCH SUT TGRTAPE 1.3X2.6X1.7 (Anchor) ×1 IMPLANT
ANCHOR SUT FBRTK 2.6 SOFT 1.7 (Anchor) IMPLANT
ANCHOR SUT FBRTK 2.6 SP1.7 TAP (Anchor) IMPLANT
ANCHOR SWIVELOCK SP KL 4.75 (Anchor) IMPLANT
APL PRP STRL LF DISP 70% ISPRP (MISCELLANEOUS) ×1
BLADE EXCALIBUR 4.0X13 (MISCELLANEOUS) IMPLANT
BURR OVAL 8 FLU 5.0X13 (MISCELLANEOUS) ×1 IMPLANT
CANNULA 5.75X71 LONG (CANNULA) IMPLANT
CANNULA TWIST IN 8.25X7CM (CANNULA) IMPLANT
CHLORAPREP W/TINT 26 (MISCELLANEOUS) ×1 IMPLANT
DISSECTOR  3.8MM X 13CM (MISCELLANEOUS) ×1
DISSECTOR 3.8MM X 13CM (MISCELLANEOUS) ×1 IMPLANT
DRAPE IMP U-DRAPE 54X76 (DRAPES) ×1 IMPLANT
DRAPE INCISE IOBAN 66X45 STRL (DRAPES) ×1 IMPLANT
DRAPE POUCH INSTRU U-SHP 10X18 (DRAPES) ×1 IMPLANT
DRAPE SHOULDER BEACH CHAIR (DRAPES) ×1 IMPLANT
DRAPE U-SHAPE 47X51 STRL (DRAPES) ×1 IMPLANT
DRSG EMULSION OIL 3X3 NADH (GAUZE/BANDAGES/DRESSINGS) ×1 IMPLANT
GAUZE PAD ABD 8X10 STRL (GAUZE/BANDAGES/DRESSINGS) ×1 IMPLANT
GAUZE SPONGE 4X4 12PLY STRL (GAUZE/BANDAGES/DRESSINGS) ×2 IMPLANT
GLOVE BIO SURGEON STRL SZ7.5 (GLOVE) ×1 IMPLANT
GLOVE BIOGEL PI IND STRL 7.5 (GLOVE) ×2 IMPLANT
GLOVE BIOGEL PI IND STRL 8 (GLOVE) ×1 IMPLANT
GLOVE SURG SYN 7.5  E (GLOVE) ×1
GLOVE SURG SYN 7.5 E (GLOVE) ×1 IMPLANT
GLOVE SURG SYN 7.5 PF PI (GLOVE) ×1 IMPLANT
GOWN STRL REUS W/ TWL LRG LVL3 (GOWN DISPOSABLE) ×1 IMPLANT
GOWN STRL REUS W/ TWL XL LVL3 (GOWN DISPOSABLE) ×1 IMPLANT
GOWN STRL REUS W/TWL LRG LVL3 (GOWN DISPOSABLE) ×1
GOWN STRL REUS W/TWL XL LVL3 (GOWN DISPOSABLE) ×2 IMPLANT
KIT PUSHLOCK 2.9 HIP (KITS) IMPLANT
MANIFOLD NEPTUNE II (INSTRUMENTS) ×1 IMPLANT
NDL HD SCORPION MEGA LOADER (NEEDLE) IMPLANT
NS IRRIG 1000ML POUR BTL (IV SOLUTION) IMPLANT
PACK ARTHROSCOPY DSU (CUSTOM PROCEDURE TRAY) ×1 IMPLANT
PACK BASIN DAY SURGERY FS (CUSTOM PROCEDURE TRAY) ×1 IMPLANT
PORT APPOLLO RF 90DEGREE MULTI (SURGICAL WAND) ×1 IMPLANT
RESTRAINT HEAD UNIVERSAL NS (MISCELLANEOUS) ×1 IMPLANT
SLEEVE SCD COMPRESS KNEE MED (STOCKING) ×1 IMPLANT
SLING ARM FOAM STRAP LRG (SOFTGOODS) IMPLANT
SUT ETHILON 3 0 PS 1 (SUTURE) ×1 IMPLANT
SUT FIBERWIRE #2 38 T-5 BLUE (SUTURE)
SUT MNCRL AB 4-0 PS2 18 (SUTURE) IMPLANT
SUT TIGER TAPE 7 IN WHITE (SUTURE) IMPLANT
SUTURE FIBERWR #2 38 T-5 BLUE (SUTURE) IMPLANT
SUTURE TAPE TIGERLINK 1.3MM BL (SUTURE) IMPLANT
SUTURETAPE TIGERLINK 1.3MM BL (SUTURE)
SYSTEM IMPL TENODESIS LNT 2.9 (Orthopedic Implant) IMPLANT
TAPE FIBER 2MM 7IN #2 BLUE (SUTURE) IMPLANT
TOWEL GREEN STERILE FF (TOWEL DISPOSABLE) ×1 IMPLANT
TUBE CONNECTING 20X1/4 (TUBING) ×2 IMPLANT
TUBING ARTHROSCOPY IRRIG 16FT (MISCELLANEOUS) ×1 IMPLANT
WATER STERILE IRR 1000ML POUR (IV SOLUTION) ×1 IMPLANT

## 2022-11-26 NOTE — Anesthesia Procedure Notes (Signed)
Anesthesia Regional Block: Interscalene brachial plexus block   Pre-Anesthetic Checklist: , timeout performed,  Correct Patient, Correct Site, Correct Laterality,  Correct Procedure, Correct Position, site marked,  Risks and benefits discussed,  Surgical consent,  Pre-op evaluation,  At surgeon's request and post-op pain management  Laterality: Right  Prep: chloraprep       Needles:  Injection technique: Single-shot  Needle Type: Echogenic Stimulator Needle     Needle Length: 9cm  Needle Gauge: 21     Additional Needles:   Procedures:,,,, ultrasound used (permanent image in chart),,    Narrative:  Start time: 11/26/2022 10:17 AM End time: 11/26/2022 10:20 AM Injection made incrementally with aspirations every 5 mL.  Performed by: Personally  Anesthesiologist: Linton Rump, MD  Additional Notes: Discussed risks and benefits of nerve block including, but not limited to, prolonged and/or permanent nerve injury involving sensory and/or motor function. Monitors were applied and a time-out was performed. The nerve and associated structures were visualized under ultrasound guidance. After negative aspiration, local anesthetic was slowly injected around the nerve. There was no evidence of high pressure during the procedure. There were no paresthesias. VSS remained stable and the patient tolerated the procedure well.

## 2022-11-26 NOTE — Interval H&P Note (Signed)
History and Physical Interval Note:  11/26/2022 12:04 PM  Wesley Prince  has presented today for surgery, with the diagnosis of RIGHT SHOULDER BICEPS TENODESIS, BUSITIS.  The various methods of treatment have been discussed with the patient and family. After consideration of risks, benefits and other options for treatment, the patient has consented to  Procedure(s): SHOULDER ARTHROSCOPY WITH SUBACROMIAL DECOMPRESSION, BICEPS TENODESIS AND DISTAL CLAVICLE EXCISION (Right) as a surgical intervention.  The patient's history has been reviewed, patient examined, no change in status, stable for surgery.  I have reviewed the patient's chart and labs.  Questions were answered to the patient's satisfaction.     Sheral Apley

## 2022-11-26 NOTE — Anesthesia Postprocedure Evaluation (Signed)
Anesthesia Post Note  Patient: BOW ADER  Procedure(s) Performed: SHOULDER ARTHROSCOPY WITH SUBACROMIAL DECOMPRESSION, BICEPS TENODESIS AND DISTAL CLAVICLE EXCISION (Right: Shoulder)     Patient location during evaluation: PACU Anesthesia Type: General Level of consciousness: awake Pain management: pain level controlled Vital Signs Assessment: post-procedure vital signs reviewed and stable Respiratory status: spontaneous breathing, nonlabored ventilation and respiratory function stable Cardiovascular status: blood pressure returned to baseline and stable Postop Assessment: no apparent nausea or vomiting Anesthetic complications: no   No notable events documented.  Last Vitals:  Vitals:   11/26/22 1500 11/26/22 1508  BP: (!) 143/97   Pulse: (!) 57 70  Resp: 14 12  Temp:    SpO2: 97% 93%    Last Pain:  Vitals:   11/26/22 1508  TempSrc:   PainSc: 2                  Linton Rump

## 2022-11-26 NOTE — Anesthesia Procedure Notes (Signed)
Procedure Name: Intubation Date/Time: 11/26/2022 1:13 PM  Performed by: Burna Cash, CRNAPre-anesthesia Checklist: Patient identified, Emergency Drugs available, Suction available and Patient being monitored Patient Re-evaluated:Patient Re-evaluated prior to induction Oxygen Delivery Method: Circle system utilized Preoxygenation: Pre-oxygenation with 100% oxygen Induction Type: IV induction Ventilation: Mask ventilation without difficulty Laryngoscope Size: Mac and 4 Grade View: Grade I Tube type: Oral Tube size: 7.5 mm Number of attempts: 1 Airway Equipment and Method: Stylet and Oral airway Placement Confirmation: ETT inserted through vocal cords under direct vision, positive ETCO2 and breath sounds checked- equal and bilateral Secured at: 23 cm Tube secured with: Tape Dental Injury: Teeth and Oropharynx as per pre-operative assessment

## 2022-11-26 NOTE — Progress Notes (Signed)
Assisted Dr. Jennifer Allan with right, interscalene , ultrasound guided block. Side rails up, monitors on throughout procedure. See vital signs in flow sheet. Tolerated Procedure well. 

## 2022-11-26 NOTE — Discharge Instructions (Addendum)
[facility] [address] [phone] (tel)   [phone] (fax)   POST-OPERATIVE INSTRUCTIONS - SHOULDER ARTHROSCOPY  WOUND CARE You may remove the Operative Dressing on Post-Op Day #3 (72hrs after surgery).   Alternatively if you would like you can leave dressing on until follow-up if within 7-8 days but keep it dry. Leave steri-strips in place until they fall off on their own, usually 2 weeks postop. There may be a small amount of fluid/bleeding leaking at the surgical site.  This is normal; the shoulder is filled with fluid during the procedure and can leak for 24-48hrs after surgery.  You may change/reinforce the bandage as needed.  Use the Cryocuff or Ice as often as possible for the first 7 days, then as needed for pain relief. Always keep a towel, ACE wrap or other barrier between the cooling unit and your skin.  You may shower on Post-Op Day #3. Gently pat the area dry.  Do not soak the shoulder in water or submerge it.  Keep incisions as dry as possible. Do not go swimming in the pool or ocean until 4 weeks after surgery or when otherwise instructed.    EXERCISES Wear the sling at all times  You may remove the sling for showering, but keep the arm across the chest or in a secondary sling.     It is normal for your fingers/hand to become more swollen after surgery and discolored from bruising.   This will resolve over the first few weeks usually after surgery. Please continue to ambulate and do not stay sitting or lying for too long.  Perform foot and wrist pumps to assist in circulation.  REGIONAL ANESTHESIA (NERVE BLOCKS) The anesthesia team may have performed a nerve block for you this is a great tool used to minimize pain.   The block may start wearing off overnight (between 8-24 hours postop) When the block wears off, your pain may go from nearly zero to the pain you would have had postop without the block. This is an  abrupt transition but nothing dangerous is happening.   This can be a challenging period but utilize your as needed pain medications to try and manage this period. We suggest you use the pain medication the first night prior to going to bed, to ease this transition.  You may take an extra dose of narcotic when this happens if needed  POST-OP MEDICATIONS- Multimodal approach to pain control In general your pain will be controlled with a combination of substances.  Prescriptions unless otherwise discussed are electronically sent to your pharmacy.  This is a carefully made plan we use to minimize narcotic use.     Acetaminophen - Non-narcotic pain medicine taken on a scheduled basis  Diclofenac - this is an anti-inflammatory, take on a scheduled basis Oxycodone - This is a strong narcotic, to be used only on an "as needed" basis for SEVERE pain. Aspirin 81mg  - This medicine is used to minimize the risk of blood clots after surgery. Zofran - take as needed for nausea   FOLLOW-UP If you develop a Fever (?101.5), Redness or Drainage from the surgical incision site, please call our office to arrange for an evaluation. Please call the office to schedule a follow-up appointment for your first post-operative appointment, 10-14 days post-operatively.    HELPFUL INFORMATION   You may be more comfortable sleeping in a semi-seated position the first few nights following surgery.  Keep a pillow  propped under the elbow and forearm for comfort.  If you have a recliner type of chair it might be beneficial.  If not that is fine too, but it would be helpful to sleep propped up with pillows behind your operated shoulder as well under your elbow and forearm.  This will reduce pulling on the suture lines.  When dressing, put your operative arm in the sleeve first.  When getting undressed, take your operative arm out last.  Loose fitting, button-down shirts are recommended.  Often in the first days after surgery  you may be more comfortable keeping your operative arm under your shirt and not through the sleeve.  You may return to work/school in the next couple of days when you feel up to it.  Desk work and typing in the sling is fine.  We suggest you use the pain medication the first night prior to going to bed, in order to ease any pain when the anesthesia wears off. You should avoid taking pain medications on an empty stomach as it will make you nauseous.  You should wean off your narcotic medicines as soon as you are able.  Most patients will be off or using minimal narcotics before their first postop appointment.   Do not drink alcoholic beverages or take illicit drugs when taking pain medications.  It is against the law to drive while taking narcotics.  In some states it is against the law to drive while your arm is in a sling.   Pain medication may make you constipated.  Below are a few solutions to try in this order: Decrease the amount of pain medication if you aren't having pain. Drink lots of decaffeinated fluids. Drink prune juice and/or eat dried prunes  If the first 3 don't work start with additional solutions Take Colace - an over-the-counter stool softener Take Senokot - an over-the-counter laxative Take Miralax - a stronger over-the-counter laxative        Post Anesthesia Home Care Instructions  Activity: Get plenty of rest for the remainder of the day. A responsible individual must stay with you for 24 hours following the procedure.  For the next 24 hours, DO NOT: -Drive a car -Advertising copywriter -Drink alcoholic beverages -Take any medication unless instructed by your physician -Make any legal decisions or sign important papers.  Meals: Start with liquid foods such as gelatin or soup. Progress to regular foods as tolerated. Avoid greasy, spicy, heavy foods. If nausea and/or vomiting occur, drink only clear liquids until the nausea and/or vomiting subsides. Call your  physician if vomiting continues.  Special Instructions/Symptoms: Your throat may feel dry or sore from the anesthesia or the breathing tube placed in your throat during surgery. If this causes discomfort, gargle with warm salt water. The discomfort should disappear within 24 hours.       Regional Anesthesia Blocks  1. Numbness or the inability to move the "blocked" extremity may last from 3-48 hours after placement. The length of time depends on the medication injected and your individual response to the medication. If the numbness is not going away after 48 hours, call your surgeon.  2. The extremity that is blocked will need to be protected until the numbness is gone and the  Strength has returned. Because you cannot feel it, you will need to take extra care to avoid injury. Because it may be weak, you may have difficulty moving it or using it. You may not know what position it is in without  looking at it while the block is in effect.  3. For blocks in the legs and feet, returning to weight bearing and walking needs to be done carefully. You will need to wait until the numbness is entirely gone and the strength has returned. You should be able to move your leg and foot normally before you try and bear weight or walk. You will need someone to be with you when you first try to ensure you do not fall and possibly risk injury.  4. Bruising and tenderness at the needle site are common side effects and will resolve in a few days.  5. Persistent numbness or new problems with movement should be communicated to the surgeon or the [facility] [phone] [facility] [phone]).   Information for Discharge Teaching: EXPAREL (bupivacaine liposome injectable suspension)   Your surgeon or anesthesiologist gave you EXPAREL(bupivacaine) to help control your pain after surgery.  EXPAREL is a local anesthetic that provides pain relief by numbing the tissue around the  surgical site. EXPAREL is designed to release pain medication over time and can control pain for up to 72 hours. Depending on how you respond to EXPAREL, you may require less pain medication during your recovery.  Possible side effects: Temporary loss of sensation or ability to move in the area where bupivacaine was injected. Nausea, vomiting, constipation Rarely, numbness and tingling in your mouth or lips, lightheadedness, or anxiety may occur. Call your doctor right away if you think you may be experiencing any of these sensations, or if you have other questions regarding possible side effects.  Follow all other discharge instructions given to you by your surgeon or nurse. Eat a healthy diet and drink plenty of water or other fluids.  If you return to the hospital for any reason within 96 hours following the administration of EXPAREL, it is important for health care providers to know that you have received this anesthetic. A teal colored band has been placed on your arm with the date, time and amount of EXPAREL you have received in order to alert and inform your health care providers. Please leave this armband in place for the full 96 hours following administration, and then you may remove the band.   Next dose of Tylenol may be given at 4pm if needed.

## 2022-11-26 NOTE — Transfer of Care (Signed)
Immediate Anesthesia Transfer of Care Note  Patient: Wesley Prince  Procedure(s) Performed: SHOULDER ARTHROSCOPY WITH SUBACROMIAL DECOMPRESSION, BICEPS TENODESIS AND DISTAL CLAVICLE EXCISION (Right: Shoulder)  Patient Location: PACU  Anesthesia Type:GA combined with regional for post-op pain  Level of Consciousness: awake, alert , and oriented  Airway & Oxygen Therapy: Patient Spontanous Breathing and Patient connected to face mask oxygen  Post-op Assessment: Report given to RN and Post -op Vital signs reviewed and stable  Post vital signs: Reviewed and stable  Last Vitals:  Vitals Value Taken Time  BP    Temp    Pulse    Resp    SpO2      Last Pain:  Vitals:   11/26/22 1000  TempSrc: Oral  PainSc: 10-Worst pain ever         Complications: No notable events documented.

## 2022-11-27 NOTE — Op Note (Signed)
11/26/2022  7:56 AM  PATIENT:  Wesley Prince    PRE-OPERATIVE DIAGNOSIS:  RIGHT SHOULDER OSTEOARTHRITIS, BICEPS TENDINITIS  POST-OPERATIVE DIAGNOSIS:  Same  PROCEDURE:  SHOULDER ARTHROSCOPY WITH SUBACROMIAL DECOMPRESSION, BICEPS TENODESIS AND DISTAL CLAVICLE EXCISION  SURGEON:  Sheral Apley, MD  ASSISTANT: Levester Fresh, PA-C, he was present and scrubbed throughout the case, critical for completion in a timely fashion, and for retraction, instrumentation, and closure.   ANESTHESIA:   gen  PREOPERATIVE INDICATIONS:  [name] is a  62 y.o. male with a diagnosis of RIGHT SHOULDER OSTEOARTHRITIS, BICEPS TENDINITIS who failed conservative measures and elected for surgical management.    The risks benefits and alternatives were discussed with the patient preoperatively including but not limited to the risks of infection, bleeding, nerve injury, cardiopulmonary complications, the need for revision surgery, among others, and the patient was willing to proceed.  OPERATIVE IMPLANTS: stryker anchors  OPERATIVE FINDINGS: Supraspinatus tear, SLAP tear, 7mm loose body, SAI, Distal clav OA  BLOOD LOSS: min  COMPLICATIONS: none  TOURNIQUET TIME: none  OPERATIVE PROCEDURE:  Patient was identified in the preoperative holding area and site was marked by me He was transported to the operating theater and placed on the table in supine position taking care to pad all bony prominences. After a preincinduction time out anesthesia was induced. The right upper extremity was prepped and draped in normal sterile fashion and a pre-incision timeout was performed. He received ancef for preoperative antibiotics.   I created a posterior anterior arthroscopic portal into the shoulder and inserted the arthroscope into the glenohumeral space and noted SLAP tear.  He had intact glenohumeral cartilage.  I performed an extensive debridement of the synovium glenoid rim subdeltoid bursa  I performed a loop  intact biceps tenodesis  Next I inserted the camera into the subacromial space I debrided the subacromial bursa he had significant impingement and I performed a subacromial decompression using a bur  Next using a bur I performed a distal clavicle excision and was happy with this.  Next I turned my attention to the rotator cuff there was a thin portion of the posterior aspect of the supraspinatus there was near complete tear I debrided the here and then performed a 2 x 1 dual row repair.  In the glenohumeral space he had a 7 mm loose body I expanded the anterior portal and used a grabber to remove this.  Sterile dressings were applied after wound closure he was taken the PACU in stable condition  POST OPERATIVE PLAN: Sling full-time

## 2023-03-10 ENCOUNTER — Other Ambulatory Visit: Payer: Self-pay | Admitting: Internal Medicine

## 2023-03-11 LAB — LIPID PANEL
Cholesterol: 190 mg/dL (ref <200)
HDL: 55 mg/dL (ref >=40)
LDL Cholesterol (Calc): 107 mg/dL — ABNORMAL HIGH
Non-HDL Cholesterol (Calc): 135 mg/dL — ABNORMAL HIGH (ref <130)
Total CHOL/HDL Ratio: 3.5 (calc) (ref <5.0)
Triglycerides: 160 mg/dL — ABNORMAL HIGH (ref <150)

## 2023-03-11 LAB — CBC
HCT: 40.7 % (ref 38.5–50.0)
Hemoglobin: 13.7 g/dL (ref 13.2–17.1)
MCH: 30 pg (ref 27.0–33.0)
MCHC: 33.7 g/dL (ref 32.0–36.0)
MCV: 89.1 fL (ref 80.0–100.0)
MPV: 9.9 fL (ref 7.5–12.5)
Platelets: 219 10*3/uL (ref 140–400)
RBC: 4.57 10*6/uL (ref 4.20–5.80)
RDW: 13 % (ref 11.0–15.0)
WBC: 6.4 10*3/uL (ref 3.8–10.8)

## 2023-03-11 LAB — COMPLETE METABOLIC PANEL WITH GFR
AG Ratio: 1.4 (calc) (ref 1.0–2.5)
ALT: 19 U/L (ref 9–46)
AST: 12 U/L (ref 10–35)
Albumin: 3.8 g/dL (ref 3.6–5.1)
Alkaline phosphatase (APISO): 52 U/L (ref 35–144)
BUN: 15 mg/dL (ref 7–25)
CO2: 23 mmol/L (ref 20–32)
Calcium: 9.3 mg/dL (ref 8.6–10.3)
Chloride: 107 mmol/L (ref 98–110)
Creat: 0.92 mg/dL (ref 0.70–1.35)
Globulin: 2.8 g/dL (ref 1.9–3.7)
Glucose, Bld: 95 mg/dL (ref 65–99)
Potassium: 4.2 mmol/L (ref 3.5–5.3)
Sodium: 141 mmol/L (ref 135–146)
Total Bilirubin: 0.3 mg/dL (ref 0.2–1.2)
Total Protein: 6.6 g/dL (ref 6.1–8.1)
eGFR: 95 mL/min/{1.73_m2} (ref >=60)

## 2023-03-11 LAB — PSA: PSA: 0.49 ng/mL (ref <=4.00)

## 2023-03-11 LAB — TSH: TSH: 0.83 m[IU]/L (ref 0.40–4.50)

## 2023-03-11 LAB — URIC ACID: Uric Acid, Serum: 9.6 mg/dL — ABNORMAL HIGH (ref 4.0–8.0)

## 2023-03-11 LAB — VITAMIN D 25 HYDROXY (VIT D DEFICIENCY, FRACTURES): Vit D, 25-Hydroxy: 20 ng/mL — ABNORMAL LOW (ref 30–100)

## 2023-07-17 ENCOUNTER — Ambulatory Visit: Payer: 59 | Admitting: Gastroenterology

## 2023-07-17 ENCOUNTER — Encounter: Payer: Self-pay | Admitting: Gastroenterology

## 2023-07-17 VITALS — BP 130/80 | HR 62 | Ht 69.0 in | Wt 195.0 lb

## 2023-07-17 DIAGNOSIS — K921 Melena: Secondary | ICD-10-CM

## 2023-07-17 DIAGNOSIS — Z860100 Personal history of colon polyps, unspecified: Secondary | ICD-10-CM

## 2023-07-17 DIAGNOSIS — F1090 Alcohol use, unspecified, uncomplicated: Secondary | ICD-10-CM | POA: Diagnosis not present

## 2023-07-17 DIAGNOSIS — K6289 Other specified diseases of anus and rectum: Secondary | ICD-10-CM

## 2023-07-17 DIAGNOSIS — R1084 Generalized abdominal pain: Secondary | ICD-10-CM | POA: Diagnosis not present

## 2023-07-17 MED ORDER — NA SULFATE-K SULFATE-MG SULF 17.5-3.13-1.6 GM/177ML PO SOLN: 1.0000 | Freq: Once | ORAL | 0 refills | Status: AC

## 2023-07-17 NOTE — Patient Instructions (Addendum)
 You have been scheduled for a colonoscopy. Please follow written instructions given to you at your visit today.   Please pick up your prep supplies at the pharmacy within the next 1-3 days.  If you use inhalers (even only as needed), please bring them with you on the day of your procedure.  DO NOT TAKE 7 DAYS PRIOR TO TEST- Trulicity (dulaglutide) Ozempic, Wegovy (semaglutide) Mounjaro (tirzepatide) Bydureon Bcise (exanatide extended release)  DO NOT TAKE 1 DAY PRIOR TO YOUR TEST Rybelsus (semaglutide) Adlyxin (lixisenatide) Victoza (liraglutide) Byetta (exanatide) ___________________________________________________________________________  Your provider has requested that you go to the basement level for lab work before leaving today. Press B on the elevator. The lab is located at the first door on the left as you exit the elevator.   _______________________________________________________  If your blood pressure at your visit was 140/90 or greater, please contact your primary care physician to follow up on this.  _______________________________________________________  If you are age 70 or older, your body mass index should be between 23-30. Your Body mass index is 28.8 kg/m. If this is out of the aforementioned range listed, please consider follow up with your Primary Care Provider.  If you are age 85 or younger, your body mass index should be between 19-25. Your Body mass index is 28.8 kg/m. If this is out of the aformentioned range listed, please consider follow up with your Primary Care Provider.   ________________________________________________________  The [facility] providers would like to encourage you to use MYCHART to communicate with providers for non-urgent requests or questions.  Due to long hold times on the telephone, sending your provider a message by [facility] may be a faster and more efficient way to get a response.  Please allow 48 business hours for a  response.  Please remember that this is for non-urgent requests.  _______________________________________________________ It was a pleasure to see you today!  Thank you for trusting me with your gastrointestinal care!
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
## 2023-08-06 ENCOUNTER — Emergency Department (HOSPITAL_COMMUNITY)
Admission: EM | Admit: 2023-08-06 | Discharge: 2023-08-06 | Disposition: A | Discharge status: Home / Self Care | Payer: 59 | Attending: Emergency Medicine | Admitting: Emergency Medicine

## 2023-08-06 ENCOUNTER — Other Ambulatory Visit: Payer: Self-pay

## 2023-08-06 ENCOUNTER — Encounter (HOSPITAL_COMMUNITY): Payer: Self-pay

## 2023-08-06 ENCOUNTER — Emergency Department (HOSPITAL_BASED_OUTPATIENT_CLINIC_OR_DEPARTMENT_OTHER)
Admit: 2023-08-06 | Discharge: 2023-08-06 | Disposition: A | Discharge status: Home / Self Care | Payer: 59 | Attending: Emergency Medicine

## 2023-08-06 DIAGNOSIS — M79672 Pain in left foot: Secondary | ICD-10-CM | POA: Diagnosis present

## 2023-08-06 DIAGNOSIS — M10072 Idiopathic gout, left ankle and foot: Secondary | ICD-10-CM | POA: Insufficient documentation

## 2023-08-06 DIAGNOSIS — M79662 Pain in left lower leg: Secondary | ICD-10-CM | POA: Diagnosis not present

## 2023-08-06 DIAGNOSIS — M109 Gout, unspecified: Secondary | ICD-10-CM

## 2023-08-06 LAB — COMPREHENSIVE METABOLIC PANEL
ALT: 17 U/L (ref 0–44)
AST: 12 U/L — ABNORMAL LOW (ref 15–41)
Albumin: 4.2 g/dL (ref 3.5–5.0)
Alkaline Phosphatase: 54 U/L (ref 38–126)
Anion gap: 9 (ref 5–15)
BUN: 11 mg/dL (ref 8–23)
CO2: 26 mmol/L (ref 22–32)
Calcium: 9.1 mg/dL (ref 8.9–10.3)
Chloride: 101 mmol/L (ref 98–111)
Creatinine, Ser: 0.72 mg/dL (ref 0.61–1.24)
GFR, Estimated: >60 mL/min (ref >60)
Glucose, Bld: 118 mg/dL — ABNORMAL HIGH (ref 70–99)
Potassium: 3.7 mmol/L (ref 3.5–5.1)
Sodium: 136 mmol/L (ref 135–145)
Total Bilirubin: 0.9 mg/dL (ref 0.0–1.2)
Total Protein: 8.3 g/dL — ABNORMAL HIGH (ref 6.5–8.1)

## 2023-08-06 LAB — CBC WITH DIFFERENTIAL/PLATELET
Abs Immature Granulocytes: 0.04 10*3/uL (ref 0.00–0.07)
Basophils Absolute: 0 10*3/uL (ref 0.0–0.1)
Basophils Relative: 0 %
Eosinophils Absolute: 0 10*3/uL (ref 0.0–0.5)
Eosinophils Relative: 0 %
HCT: 44.8 % (ref 39.0–52.0)
Hemoglobin: 14.5 g/dL (ref 13.0–17.0)
Immature Granulocytes: 1 %
Lymphocytes Relative: 23 %
Lymphs Abs: 1.9 10*3/uL (ref 0.7–4.0)
MCH: 29.2 pg (ref 26.0–34.0)
MCHC: 32.4 g/dL (ref 30.0–36.0)
MCV: 90.3 fL (ref 80.0–100.0)
Monocytes Absolute: 0.6 10*3/uL (ref 0.1–1.0)
Monocytes Relative: 7 %
Neutro Abs: 5.6 10*3/uL (ref 1.7–7.7)
Neutrophils Relative %: 69 %
Platelets: 210 10*3/uL (ref 150–400)
RBC: 4.96 MIL/uL (ref 4.22–5.81)
RDW: 13.2 % (ref 11.5–15.5)
WBC: 8.1 10*3/uL (ref 4.0–10.5)
nRBC: 0 % (ref 0.0–0.2)

## 2023-08-06 LAB — URIC ACID: Uric Acid, Serum: 9.5 mg/dL — ABNORMAL HIGH (ref 3.7–8.6)

## 2023-08-06 MED ORDER — OXYCODONE-ACETAMINOPHEN 5-325 MG PO TABS
1.0000 | ORAL_TABLET | Freq: Once | ORAL | Status: AC
  Administered 2023-08-06: 1 via ORAL
  Filled 2023-08-06: qty 1

## 2023-08-06 MED ORDER — PREDNISONE 10 MG (21) PO TBPK: ORAL_TABLET | ORAL | 0 refills | Status: AC

## 2023-08-06 NOTE — ED Notes (Signed)
Pt left before writer could go over discharge paperwork

## 2023-08-06 NOTE — ED Provider Notes (Signed)
 [place] EMERGENCY DEPARTMENT AT [facility] Provider Note   CSN: [id number] Arrival date & time: 08/06/23  [id number]     History Chief Complaint  Patient presents with   Foot Pain    Wesley Prince is a 63 y.o. male.  Patient presents to the emergency department concerns of foot pain.  He reports has been ongoing in the left foot for about 3 days.  Endorses some slight swelling to the left foot with pain maximal in the midfoot to the ankle.  Reports a possible history of gout has had multiple family members with gout.  Denies any pain aggravated leg in the calf or in the thigh.  No chest pain shortness of breath.  Not on blood thinners.   Foot Pain       Home Medications Prior to Admission medications   Medication Sig Start Date End Date Taking? Authorizing Provider  predniSONE (STERAPRED UNI-PAK 21 TAB) 10 MG (21) TBPK tablet Take 6 tablets (60 mg total) by mouth daily for 1 day, THEN 5 tablets (50 mg total) daily for 1 day, THEN 4 tablets (40 mg total) daily for 1 day, THEN 3 tablets (30 mg total) daily for 1 day, THEN 2 tablets (20 mg total) daily for 1 day, THEN 1 tablet (10 mg total) daily for 1 day. 08/06/23 08/12/23 Yes Smitty Knudsen, PA-C  albuterol (VENTOLIN HFA) 108 (90 Base) MCG/ACT inhaler Inhale 1-2 puffs into the lungs every 6 (six) hours as needed for wheezing or shortness of breath. 02/14/22   Sloan Leiter, DO  cyclobenzaprine (FLEXERIL) 10 MG tablet  02/29/20   [provider]  diclofenac (VOLTAREN) 75 MG EC tablet Take 1 tablet (75 mg total) by mouth 2 (two) times daily. 11/26/22   McBane, Jerald Kief, PA-C  fluticasone (FLONASE) 50 MCG/ACT nasal spray Place into both nostrils. 01/16/21   [provider]  gabapentin (NEURONTIN) 100 MG capsule  02/29/20   [provider]  KENALOG 40 MG/ML injection SMARTSIG:2 Milliliter(s) IM Once PRN 02/14/21   [provider]  lidocaine (LIDODERM) 5 % Place 1 patch onto the skin daily. Remove &  Discard patch within 12 hours or as directed by MD 11/17/22   Kommor, Wyn Forster, MD  olmesartan-hydrochlorothiazide (BENICAR HCT) 20-12.5 MG tablet Take by mouth. 01/24/20   [provider]  PROCTO-MED [facility] 2.5 % rectal cream SMARTSIG:Rectally 4 Times Daily PRN 03/21/21   [provider]  sildenafil (REVATIO) 20 MG tablet SMARTSIG:3-5 Tablet(s) By Mouth Daily PRN 01/24/20   [provider]  sildenafil (VIAGRA) 100 MG tablet Take 100 mg by mouth daily as needed. 02/23/21   [provider]  triamcinolone cream (KENALOG) 0.5 %  03/01/20   [provider]      Allergies    Patient has no known allergies.    Review of Systems   Review of Systems  Musculoskeletal:  Positive for joint swelling.  All other systems reviewed and are negative.   Physical Exam Updated Vital Signs BP 127/89   Pulse 62   Temp 98.3 F (36.8 C) (Oral)   Resp 18   Ht 5\' 9"  (1.753 m)   Wt 88 kg   SpO2 100%   BMI 28.65 kg/m  Physical Exam Vitals and nursing note reviewed.  Constitutional:      General: He is not in acute distress.    Appearance: He is well-developed.  HENT:     Head: Normocephalic and atraumatic.  Eyes:  Conjunctiva/sclera: Conjunctivae normal.  Cardiovascular:     Rate and Rhythm: Normal rate and regular rhythm.     Heart sounds: No murmur heard. Pulmonary:     Effort: Pulmonary effort is normal. No respiratory distress.     Breath sounds: Normal breath sounds.  Abdominal:     Palpations: Abdomen is soft.     Tenderness: There is no abdominal tenderness.  Musculoskeletal:        General: Tenderness present. No swelling, deformity or signs of injury. Normal range of motion.     Cervical back: Neck supple.     Comments: Minor swelling noted to the dorsum of the left foot with no pitting edema present.  Palpable pulses bilaterally.  Negative Hoffmann sign.  Skin:    General: Skin is warm and dry.     Capillary Refill: Capillary refill takes less  than 2 seconds.  Neurological:     Mental Status: He is alert.  Psychiatric:        Mood and Affect: Mood normal.     ED Results / Procedures / Treatments   Labs (all labs ordered are listed, but only abnormal results are displayed) Labs Reviewed  COMPREHENSIVE METABOLIC PANEL - Abnormal; Notable for the following components:      Result Value   Glucose, Bld 118 (*)    Total Protein 8.3 (*)    AST 12 (*)    All other components within normal limits  CBC WITH DIFFERENTIAL/PLATELET  URIC ACID    EKG None  Radiology VAS [place] LOWER EXTREMITY VENOUS (DVT) (7a-7p) Result Date: 08/06/2023  Lower Venous DVT Study Patient Name:  Wesley Prince  Date of Exam:   08/06/2023 Medical Rec #: [id number]      Accession #:    [id number] Date of Birth: 07/24/1960      Patient Gender: M Patient Age:   63 years Exam Location:  [facility] Procedure:      VAS [place] LOWER EXTREMITY VENOUS (DVT) Referring Phys: Maryanna Shape --------------------------------------------------------------------------------  Indications: Pain.  Risk Factors: None identified. Comparison Study: No prior studies. Performing Technologist: Chanda Busing RVT  Examination Guidelines: A complete evaluation includes B-mode imaging, spectral Doppler, color Doppler, and power Doppler as needed of all accessible portions of each vessel. Bilateral testing is considered an integral part of a complete examination. Limited examinations for reoccurring indications may be performed as noted. The reflux portion of the exam is performed with the patient in reverse Trendelenburg.  +-----+---------------+---------+-----------+----------+--------------+ RIGHTCompressibilityPhasicitySpontaneityPropertiesThrombus Aging +-----+---------------+---------+-----------+----------+--------------+ CFV  Full           Yes      Yes                                 +-----+---------------+---------+-----------+----------+--------------+    +---------+---------------+---------+-----------+----------+--------------+ LEFT     CompressibilityPhasicitySpontaneityPropertiesThrombus Aging +---------+---------------+---------+-----------+----------+--------------+ CFV      Full           Yes      Yes                                 +---------+---------------+---------+-----------+----------+--------------+ SFJ      Full                                                        +---------+---------------+---------+-----------+----------+--------------+  FV Prox  Full                                                        +---------+---------------+---------+-----------+----------+--------------+ FV Mid   Full                                                        +---------+---------------+---------+-----------+----------+--------------+ FV DistalFull                                                        +---------+---------------+---------+-----------+----------+--------------+ PFV      Full                                                        +---------+---------------+---------+-----------+----------+--------------+ POP      Full           Yes      Yes                                 +---------+---------------+---------+-----------+----------+--------------+ PTV      Full                                                        +---------+---------------+---------+-----------+----------+--------------+ PERO     Full                                                        +---------+---------------+---------+-----------+----------+--------------+     Summary: RIGHT: - No evidence of common femoral vein obstruction.   LEFT: - There is no evidence of deep vein thrombosis in the lower extremity.  - No cystic structure found in the popliteal fossa.  *See table(s) above for measurements and observations.    Preliminary     Procedures Procedures   Medications Ordered in ED Medications   oxyCODONE-acetaminophen (PERCOCET/ROXICET) 5-325 MG per tablet 1 tablet (1 tablet Oral Given 08/06/23 1020)    ED Course/ Medical Decision Making/ A&P                                 Medical Decision Making Amount and/or Complexity of Data Reviewed Labs: ordered.  Risk Prescription drug management.   This patient presents to the ED for concern of foot pain.  Differential diagnosis includes ankle sprain, DVT, gout, septic arthritis, cellulitis   Lab Tests:  I Ordered, and personally interpreted labs.  The pertinent  results include: CBC unremarkable with no leukocytosis, CMP reassuring diagnostic findings, uric acid collected and pending   Imaging Studies ordered:  I ordered imaging studies including ultrasound of the left lower extremity I independently visualized and interpreted imaging which showed no evidence of DVT I agree with the radiologist interpretation   Medicines ordered and prescription drug management:  I ordered medication including Percocet for pain Reevaluation of the patient after these medicines showed that the patient improved I have reviewed the patients home medicines and have made adjustments as needed   Problem List / ED Course:  Patient without significant history presents emergency department concerns of left foot pain.  Endorses some swelling to the dorsal aspect of the left foot.  No pitting edema.  Not on blood thinners or history of CHF.  Denies any recent injury to the area or other falls. On exam, (reassuring appearance although some mild swelling for the right foot.  No pitting edema.  No erythema.  Tender to palpation along the entire dorsum of the left foot with maximal tenderness along the medial aspect of the ankle.  Concern for possible DVT given areas of swelling will obtain ultrasound imaging.  Suspect this could also be gout does not appear to be infectious based on appearance.  Will obtain labs for evaluation. Labs reassuring and  ultrasound negative for DVT.  Doubt this is infectious as there is no significant erythema and white count is unremarkable.  Also treat this as gout with a course of prednisone.  Patient agreeable with this current plan.  Advised patient that uric acid level will likely result in the next several hours to the next day.  Discussed return precautions such as worsening pain, erythema, and fevers.  At this time encourage close outpatient follow-up with PCP.  Patient discharged home in stable condition.  Final Clinical Impression(s) / ED Diagnoses Final diagnoses:  Acute gout of left foot, unspecified cause    Rx / DC Orders ED Discharge Orders          Ordered    predniSONE (STERAPRED UNI-PAK 21 TAB) 10 MG (21) TBPK tablet  Daily        08/06/23 1322              Smitty Knudsen, PA-C 08/06/23 1342    Franne Forts, DO 08/07/23 [phone]

## 2023-08-06 NOTE — Progress Notes (Signed)
Left lower extremity venous duplex has been completed. Preliminary results can be found in CV Proc through chart review.  Results were given to   08/06/23 9:58 AM Olen Cordial RVT

## 2023-08-06 NOTE — ED Notes (Signed)
Vascular tech in room for VAS LOWER EXTREMITY

## 2023-08-06 NOTE — Discharge Instructions (Signed)
You were seen in the ER today for concerns of foot pain. Your labs and ultrasound were negative. Your uric acid level is still pending. I have sent a course of Prednisone to try to help address what I suspect is gout related pain. Please take this as prescribed. Return to the ER for any worsening symptoms or signs of infection like redness or severe swelling as well as fevers.

## 2023-08-06 NOTE — ED Triage Notes (Signed)
Per EMS, Pt coming from home. Pt complaining of left foot pain that began 3x days ago. Progressively becoming worse with increased swelling. Unable to bear weight on foot today. Possible hx of gout.

## 2023-08-08 ENCOUNTER — Telehealth: Payer: Self-pay | Admitting: Podiatry

## 2023-08-08 NOTE — Telephone Encounter (Signed)
Called pt after talking with Dr Ardelle Anton and he was at his pcp and they are taking care of it for him so he did not need an appt with our office at this time

## 2023-08-08 NOTE — Telephone Encounter (Signed)
Pt called and was seen at [facility] this week and they did a lot of test just waiting on uric acid test results. They did start pt on prednisone and allopurinol and gave him oxycodone for the pain. He is calling because he is having pain and swelling in the foot and wanted to be seen today.He states he cannot walk on foot. Please advise if an appt is needed since pt is already on the medication for possible gout?

## 2023-08-22 ENCOUNTER — Encounter: Payer: Self-pay | Admitting: Gastroenterology

## 2023-08-29 ENCOUNTER — Telehealth: Payer: Self-pay | Admitting: Gastroenterology

## 2023-08-29 ENCOUNTER — Other Ambulatory Visit: Payer: Self-pay

## 2023-08-29 MED ORDER — NA SULFATE-K SULFATE-MG SULF 17.5-3.13-1.6 GM/177ML PO SOLN: 1.0000 | Freq: Once | ORAL | 0 refills | Status: AC

## 2023-08-29 MED ORDER — NA SULFATE-K SULFATE-MG SULF 17.5-3.13-1.6 GM/177ML PO SOLN: 1.0000 | Freq: Once | ORAL | 0 refills | Status: DC

## 2023-08-29 NOTE — Telephone Encounter (Signed)
Returned the patient's call and resent the Suprep to the [facility] in [place].

## 2023-08-29 NOTE — Telephone Encounter (Signed)
Inbound call from patient stating he went to the pharmacy to pick up his prep and they did not have it. Patient is requesting prep be resent to the pharmacy. Please advise.

## 2023-08-29 NOTE — Telephone Encounter (Signed)
Inbound call from patient, would like prep medication sent to [facility] on [address], is unsure why [facility] on [address] is still on file, would like pharmacy taken off chart.

## 2023-09-01 ENCOUNTER — Ambulatory Visit (AMBULATORY_SURGERY_CENTER): Payer: 59 | Admitting: Gastroenterology

## 2023-09-01 ENCOUNTER — Encounter: Payer: Self-pay | Admitting: Gastroenterology

## 2023-09-01 VITALS — BP 122/75 | HR 86 | Temp 97.4°F | Resp 14 | Ht 69.0 in | Wt 195.0 lb

## 2023-09-01 DIAGNOSIS — Z1211 Encounter for screening for malignant neoplasm of colon: Secondary | ICD-10-CM | POA: Diagnosis not present

## 2023-09-01 DIAGNOSIS — K2970 Gastritis, unspecified, without bleeding: Secondary | ICD-10-CM

## 2023-09-01 DIAGNOSIS — D124 Benign neoplasm of descending colon: Secondary | ICD-10-CM

## 2023-09-01 DIAGNOSIS — K5730 Diverticulosis of large intestine without perforation or abscess without bleeding: Secondary | ICD-10-CM | POA: Diagnosis not present

## 2023-09-01 DIAGNOSIS — Z860100 Personal history of colon polyps, unspecified: Secondary | ICD-10-CM

## 2023-09-01 DIAGNOSIS — D122 Benign neoplasm of ascending colon: Secondary | ICD-10-CM

## 2023-09-01 DIAGNOSIS — K3189 Other diseases of stomach and duodenum: Secondary | ICD-10-CM

## 2023-09-01 DIAGNOSIS — K648 Other hemorrhoids: Secondary | ICD-10-CM

## 2023-09-01 DIAGNOSIS — K635 Polyp of colon: Secondary | ICD-10-CM | POA: Diagnosis not present

## 2023-09-01 DIAGNOSIS — R1084 Generalized abdominal pain: Secondary | ICD-10-CM

## 2023-09-01 DIAGNOSIS — K2950 Unspecified chronic gastritis without bleeding: Secondary | ICD-10-CM | POA: Diagnosis not present

## 2023-09-01 DIAGNOSIS — K921 Melena: Secondary | ICD-10-CM

## 2023-09-01 MED ORDER — SODIUM CHLORIDE 0.9 % IV SOLN: 500.0000 mL | Freq: Once | INTRAVENOUS | Status: DC
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
## 2023-09-01 NOTE — Progress Notes (Signed)
 To pacu, VSS. Report to RN.tb

## 2023-09-01 NOTE — Progress Notes (Signed)
 Called to room to assist during endoscopic procedure.  Patient ID and intended procedure confirmed with present staff. Received instructions for my participation in the procedure from the performing physician.

## 2023-09-01 NOTE — Op Note (Signed)
[facility] Patient Name: Wesley Prince Procedure Date: 09/01/2023 11:16 AM MRN: [id number] Endoscopist: Sherilyn Cooter L. Myrtie Neither , MD, [id number] Age: 63 Referring MD:  Date of Birth: 1960-08-15 Gender: Male Account #: [id number] Procedure:                Colonoscopy Indications:              Increased risk colon polyp surveillance: Personal                            history of colonic polyps                           7 TA and one SSP (plus two HP) February 2022 Medicines:                Monitored Anesthesia Care Procedure:                Pre-Anesthesia Assessment:                           - Prior to the procedure, a History and Physical                            was performed, and patient medications and                            allergies were reviewed. The patient's tolerance of                            previous anesthesia was also reviewed. The risks                            and benefits of the procedure and the sedation                            options and risks were discussed with the patient.                            All questions were answered, and informed consent                            was obtained. Prior Anticoagulants: The patient has                            taken no anticoagulant or antiplatelet agents. ASA                            Grade Assessment: III - A patient with severe                            systemic disease. After reviewing the risks and                            benefits, the patient was deemed in satisfactory  condition to undergo the procedure.                           After obtaining informed consent, the colonoscope                            was passed under direct vision. Throughout the                            procedure, the patient's blood pressure, pulse, and                            oxygen saturations were monitored continuously. The                            Olympus Scope [id number] was  introduced through the                            anus and advanced to the the terminal ileum, with                            identification of the appendiceal orifice and IC                            valve. The colonoscopy was performed without                            difficulty. The patient tolerated the procedure                            well. The quality of the bowel preparation was                            excellent. The terminal ileum, ileocecal valve,                            appendiceal orifice, and rectum were photographed. Scope In: Scope Out: Findings:                 The perianal and digital rectal examinations were                            normal.                           Repeat examination of right colon under NBI                            performed.                           The terminal ileum appeared normal.                           A diminutive polyp was found in the ascending  colon. The polyp was sessile. The polyp was removed                            with a cold snare. Resection and retrieval were                            complete.                           Diverticula were found in the entire colon.                           Two semi-sessile polyps were found in the                            descending colon. The polyps were 5 to 8 mm in                            size. These polyps were removed with a cold snare.                            Resection and retrieval were complete.                           Internal hemorrhoids were found. The hemorrhoids                            were medium-sized.                           The exam was otherwise without abnormality on                            direct and retroflexion views. Complications:            No immediate complications. Estimated Blood Loss:     Estimated blood loss was minimal. Impression:               - The examined portion of the ileum was normal.                            - One diminutive polyp in the ascending colon,                            removed with a cold snare. Resected and retrieved.                           - Diverticulosis in the entire examined colon.                           - Two 5 to 8 mm polyps in the descending colon,                            removed with a cold snare. Resected and retrieved.                           -  Internal hemorrhoids.                           - The examination was otherwise normal on direct                            and retroflexion views. Recommendation:           - Patient has a contact number available for                            emergencies. The signs and symptoms of potential                            delayed complications were discussed with the                            patient. Return to normal activities tomorrow.                            Written discharge instructions were provided to the                            patient.                           - Resume previous diet.                           - Continue present medications.                           - Await pathology results.                           - Repeat colonoscopy is recommended for                            surveillance. The colonoscopy date will be                            determined after pathology results from today's                            exam become available for review.                           - See the other procedure note for documentation of                            additional recommendations.                           -High-fiber diet (which will help give stool                            consistent form, which may decrease the reported  rectal mucus secretion) [name], MD 09/01/2023 11:57:12 AM This report has been signed electronically.

## 2023-09-01 NOTE — Op Note (Signed)
[facility] Patient Name: Wesley Prince Procedure Date: 09/01/2023 11:16 AM MRN: [id number] Endoscopist: Sherilyn Cooter L. Myrtie Neither , MD, [id number] Age: 63 Referring MD:  Date of Birth: 02-19-1961 Gender: Male Account #: [id number] Procedure:                Upper GI endoscopy Indications:              Generalized abdominal pain, reported intermittent                            black stool in the setting of NSAID use                           see recent office note for clinical details Medicines:                Monitored Anesthesia Care Procedure:                Pre-Anesthesia Assessment:                           - Prior to the procedure, a History and Physical                            was performed, and patient medications and                            allergies were reviewed. The patient's tolerance of                            previous anesthesia was also reviewed. The risks                            and benefits of the procedure and the sedation                            options and risks were discussed with the patient.                            All questions were answered, and informed consent                            was obtained. Prior Anticoagulants: The patient has                            taken no anticoagulant or antiplatelet agents. ASA                            Grade Assessment: III - A patient with severe                            systemic disease. After reviewing the risks and                            benefits, the patient was deemed in satisfactory  condition to undergo the procedure.                           - Prior to the procedure, a History and Physical                            was performed, and patient medications and                            allergies were reviewed. The patient's tolerance of                            previous anesthesia was also reviewed. The risks                            and benefits of the  procedure and the sedation                            options and risks were discussed with the patient.                            All questions were answered, and informed consent                            was obtained. Prior Anticoagulants: The patient has                            taken no anticoagulant or antiplatelet agents. ASA                            Grade Assessment: III - A patient with severe                            systemic disease. After reviewing the risks and                            benefits, the patient was deemed in satisfactory                            condition to undergo the procedure.                           After obtaining informed consent, the endoscope was                            passed under direct vision. Throughout the                            procedure, the patient's blood pressure, pulse, and                            oxygen saturations were monitored continuously. The  Olympus Scope [id number] was introduced through the                            mouth, and advanced to the second part of duodenum.                            The upper GI endoscopy was accomplished without                            difficulty. The patient tolerated the procedure                            well. Scope In: 11:21:23 AM Scope Out: 11:36:27 AM Scope Withdrawal Time: 0 hours 12 minutes 47 seconds  Total Procedure Duration: 0 hours 15 minutes 4 seconds  Findings:                 The larynx was normal.                           The esophagus was normal.                           Localized inflammation characterized by erythema                            was found in the prepyloric region of the stomach.                            Several biopsies were obtained on the greater                            curvature of the gastric body, on the lesser                            curvature of the gastric body, on the greater                             curvature of the gastric antrum and on the lesser                            curvature of the gastric antrum with cold forceps                            for histology. (One pathology jar, rule out H.                            pylori)                           The exam of the stomach was otherwise normal,                            including on retroflexion. Stomach distended well  with insufflation.                           The examined duodenum was normal. Complications:            No immediate complications. Estimated Blood Loss:     Estimated blood loss was minimal. Impression:               - Normal larynx.                           - Normal esophagus.                           - Gastritis.                           - Normal examined duodenum.                           - Several biopsies were obtained on the greater                            curvature of the gastric body, on the lesser                            curvature of the gastric body, on the greater                            curvature of the gastric antrum and on the lesser                            curvature of the gastric antrum. Recommendation:           - Patient has a contact number available for                            emergencies. The signs and symptoms of potential                            delayed complications were discussed with the                            patient. Return to normal activities tomorrow.                            Written discharge instructions were provided to the                            patient.                           - Resume previous diet.                           - Continue present medications.                           -  Await pathology results.                           - See the other procedure note for documentation of                            additional recommendations.                           - Make every effort to stop using illicit  drugs,                            particularly cocaine, as it may be contributing to                            your reported digestive symptoms.                           - Return to my office PRN.                           - Perform an abdominal ultrasound at appointment to                            be scheduled. Yachet Mattson L. Myrtie Neither, MD 09/01/2023 12:02:21 PM This report has been signed electronically.

## 2023-09-01 NOTE — Patient Instructions (Signed)
Educational handout provided to patient related to Hemorrhoids, Polyps, Diverticulosis, Gastritis  High fiber diet  Continue present medications  Awaiting pathology results   YOU HAD AN ENDOSCOPIC PROCEDURE TODAY AT THE [facility]:   Refer to the procedure report that was given to you for any specific questions about what was found during the examination.  If the procedure report does not answer your questions, please call your gastroenterologist to clarify.  If you requested that your care partner not be given the details of your procedure findings, then the procedure report has been included in a sealed envelope for you to review at your convenience later.  YOU SHOULD EXPECT: Some feelings of bloating in the abdomen. Passage of more gas than usual.  Walking can help get rid of the air that was put into your GI tract during the procedure and reduce the bloating. If you had a lower endoscopy (such as a colonoscopy or flexible sigmoidoscopy) you may notice spotting of blood in your stool or on the toilet paper. If you underwent a bowel prep for your procedure, you may not have a normal bowel movement for a few days.  Please Note:  You might notice some irritation and congestion in your nose or some drainage.  This is from the oxygen used during your procedure.  There is no need for concern and it should clear up in a day or so.  SYMPTOMS TO REPORT IMMEDIATELY:  Following lower endoscopy (colonoscopy or flexible sigmoidoscopy):  Excessive amounts of blood in the stool  Significant tenderness or worsening of abdominal pains  Swelling of the abdomen that is new, acute  Fever of 100F or higher  Following upper endoscopy (EGD)  Vomiting of blood or coffee ground material  New chest pain or pain under the shoulder blades  Painful or persistently difficult swallowing  New shortness of breath  Fever of 100F or higher  Black, tarry-looking stools  For urgent or emergent issues, a  gastroenterologist can be reached at any hour by calling [phone]. Do not use MyChart messaging for urgent concerns.    DIET:  We do recommend a small meal at first, but then you may proceed to your regular diet.  Drink plenty of fluids but you should avoid alcoholic beverages for 24 hours.  ACTIVITY:  You should plan to take it easy for the rest of today and you should NOT DRIVE or use heavy machinery until tomorrow (because of the sedation medicines used during the test).    FOLLOW UP: Our staff will call the number listed on your records the next business day following your procedure.  We will call around 7:15- 8:00 am to check on you and address any questions or concerns that you may have regarding the information given to you following your procedure. If we do not reach you, we will leave a message.     If any biopsies were taken you will be contacted by phone or by letter within the next 1-3 weeks.  Please call us at [phone] if you have not heard about the biopsies in 3 weeks.    SIGNATURES/CONFIDENTIALITY: You and/or your care partner have signed paperwork which will be entered into your electronic medical record.  These signatures attest to the fact that that the information above on your After Visit Summary has been reviewed and is understood.  Full responsibility of the confidentiality of this discharge information lies with you and/or your care-partner.

## 2023-09-02 ENCOUNTER — Telehealth: Payer: Self-pay

## 2023-09-02 NOTE — Telephone Encounter (Signed)
  Follow up Call-     09/01/2023   10:28 AM  Call back number  Post procedure Call Back phone  # [phone]  Permission to leave phone message Yes     Patient questions:  Do you have a fever, pain , or abdominal swelling? No. Pain Score  0 *  Have you tolerated food without any problems? Yes.    Have you been able to return to your normal activities? Yes.    Do you have any questions about your discharge instructions: Diet   No. Medications  No. Follow up visit  No.  Do you have questions or concerns about your Care? No.  Actions: * If pain score is 4 or above: No action needed, pain <4.

## 2023-09-03 LAB — SURGICAL PATHOLOGY

## 2023-09-04 ENCOUNTER — Encounter: Payer: Self-pay | Admitting: Gastroenterology

## 2023-09-04 ENCOUNTER — Other Ambulatory Visit: Payer: Self-pay

## 2023-09-04 DIAGNOSIS — R1084 Generalized abdominal pain: Secondary | ICD-10-CM

## 2024-01-21 ENCOUNTER — Other Ambulatory Visit: Payer: Self-pay

## 2024-01-21 ENCOUNTER — Emergency Department (HOSPITAL_COMMUNITY)
Admission: EM | Admit: 2024-01-21 | Discharge: 2024-01-21 | Disposition: A | Discharge status: Home / Self Care | Attending: Emergency Medicine | Admitting: Emergency Medicine

## 2024-01-21 ENCOUNTER — Emergency Department (HOSPITAL_COMMUNITY)

## 2024-01-21 ENCOUNTER — Encounter (HOSPITAL_COMMUNITY): Payer: Self-pay

## 2024-01-21 DIAGNOSIS — K1120 Sialoadenitis, unspecified: Secondary | ICD-10-CM | POA: Diagnosis not present

## 2024-01-21 DIAGNOSIS — J069 Acute upper respiratory infection, unspecified: Secondary | ICD-10-CM | POA: Diagnosis not present

## 2024-01-21 DIAGNOSIS — B9789 Other viral agents as the cause of diseases classified elsewhere: Secondary | ICD-10-CM | POA: Diagnosis not present

## 2024-01-21 DIAGNOSIS — R059 Cough, unspecified: Secondary | ICD-10-CM | POA: Diagnosis present

## 2024-01-21 LAB — RESP PANEL BY RT-PCR (RSV, FLU A&B, COVID)  RVPGX2
Influenza A by PCR: NEGATIVE
Influenza B by PCR: NEGATIVE
Resp Syncytial Virus by PCR: NEGATIVE
SARS Coronavirus 2 by RT PCR: NEGATIVE

## 2024-01-21 MED ORDER — DEXAMETHASONE SODIUM PHOSPHATE 10 MG/ML IJ SOLN
10.0000 mg | Freq: Once | INTRAMUSCULAR | Status: AC
  Administered 2024-01-21: 10 mg via INTRAMUSCULAR
  Filled 2024-01-21: qty 1

## 2024-01-21 NOTE — ED Provider Notes (Signed)
 Wesley Prince   CSN: [id number] Arrival date & time: 01/21/24  9077     Patient presents with: Cough and Nasal Congestion   Wesley Prince is a 63 y.o. male here with congestion, lymphadenopathy, ongoing for 5 days.  Reports initially headache now improved.  Loss of voice, improved.  Cough, worse at night.  No sick contacts.  He was concerned right sided of face felt swollen   HPI     Prior to Admission medications   Medication Sig Start Date End Date Taking? Authorizing Provider  albuterol  (VENTOLIN  HFA) 108 (90 Base) MCG/ACT inhaler Inhale 1-2 puffs into the lungs every 6 (six) hours as needed for wheezing or shortness of breath. 02/14/22   Elnor Savant A, DO  ALLOPURINOL PO Take 50 Applications by mouth daily.    [provider]  cetirizine (ZYRTEC) 10 MG tablet Take 10 mg by mouth daily as needed. 08/18/23   [provider]  colchicine 0.6 MG tablet Take 0.6 mg by mouth 2 (two) times daily as needed. 08/08/23   [provider]  cyclobenzaprine  (FLEXERIL ) 10 MG tablet  02/29/20   [provider]  diclofenac  (VOLTAREN ) 75 MG EC tablet Take 1 tablet (75 mg total) by mouth 2 (two) times daily. 11/26/22   McBane, Caroline N, PA-C  fluticasone (FLONASE) 50 MCG/ACT nasal spray Place into both nostrils. 01/16/21   [provider]  gabapentin (NEURONTIN) 100 MG capsule  02/29/20   [provider]  KENALOG 40 MG/ML injection SMARTSIG:2 Milliliter(s) IM Once PRN 02/14/21   [provider]  lidocaine  (LIDODERM ) 5 % Place 1 patch onto the skin daily. Remove & Discard patch within 12 hours or as directed by MD 11/17/22   Kommor, Lum, MD  olmesartan-hydrochlorothiazide (BENICAR HCT) 20-12.5 MG tablet Take by mouth. 01/24/20   [provider]  PROCTO-MED HC 2.5 % rectal cream SMARTSIG:Rectally 4 Times Daily PRN 03/21/21   [provider]  sildenafil (REVATIO) 20 MG tablet  SMARTSIG:3-5 Tablet(s) By Mouth Daily PRN 01/24/20   [provider]  triamcinolone cream (KENALOG) 0.5 %  03/01/20   [provider]    Allergies: Patient has no known allergies.    Review of Systems  Updated Vital Signs BP (!) 154/61 (BP Location: Right Arm)   Pulse 76   Temp 98.4 F (36.9 C) (Oral)   Resp 18   Ht 5' 9 (1.753 m)   Wt 90.7 kg   SpO2 98%   BMI 29.53 kg/m   Physical Exam Constitutional:      General: He is not in acute distress. HENT:     Head: Normocephalic and atraumatic.     Comments: Tender cervical lymphadenopathy, mildly tender right parotid gland Eyes:     Conjunctiva/sclera: Conjunctivae normal.     Pupils: Pupils are equal, round, and reactive to light.  Cardiovascular:     Rate and Rhythm: Normal rate and regular rhythm.  Pulmonary:     Effort: Pulmonary effort is normal. No respiratory distress.  Abdominal:     General: There is no distension.     Tenderness: There is no abdominal tenderness.  Skin:    General: Skin is warm and dry.  Neurological:     General: No focal deficit present.     Mental Status: He is alert. Mental status is at baseline.  Psychiatric:        Mood and Affect: Mood normal.  Behavior: Behavior normal.     (all labs ordered are listed, but only abnormal results are displayed) Labs Reviewed  RESP PANEL BY RT-PCR (RSV, FLU A&B, COVID)  [id number]    EKG: None  Radiology: DG Chest Portable 1 View Result Date: 01/21/2024 CLINICAL DATA:  cough/congestion EXAM: PORTABLE CHEST 1 VIEW COMPARISON:  11/17/2022. FINDINGS: Bilateral lung fields are clear. Bilateral costophrenic angles are clear. Normal cardio-mediastinal silhouette. No acute osseous abnormalities. The soft tissues are within normal limits. IMPRESSION: No active disease. Electronically Signed   By: Ree Molt M.D.   On: 01/21/2024 10:12     Procedures   Medications Ordered in the ED  dexamethasone  (DECADRON ) injection 10 mg (has  no administration in time range)                                    Medical Decision Making Amount and/or Complexity of Data Reviewed Radiology: ordered.  Risk Prescription drug management.   Pt here with suspected viral URI Perhaps may have mild viral parotitis Doubt sepsis, PTA - voice is clear Afebrile NO indication for antibiotics at this time Will give IM decadron  for pain/inflammation, advised cold packs on his cheek and continued conservative care at home     Final diagnoses:  Viral URI with cough  Parotitis    ED Discharge Orders     None          Cottie Donnice PARAS, MD 01/21/24 1145

## 2024-01-21 NOTE — ED Triage Notes (Addendum)
 Pt reports with nasal congestion, cough, and swollen lymph nodes to the right side of his neck since Friday. Pt reports coughing up yellow mucus at times.
